# Patient Record
Sex: Female | Born: 1970 | Race: Black or African American | Hispanic: No | Marital: Single | Smoking: Current every day smoker
Health system: Southern US, Community
[De-identification: ages and names within clinical notes are randomized; demographics above are authoritative.]

## PROBLEM LIST (undated history)

## (undated) HISTORY — PX: TUBAL LIGATION: SHX77

---

## 1999-07-24 ENCOUNTER — Emergency Department (HOSPITAL_COMMUNITY): Admission: EM | Admit: 1999-07-24 | Discharge: 1999-07-24 | Payer: Self-pay | Admitting: Emergency Medicine

## 1999-07-24 ENCOUNTER — Encounter: Payer: Self-pay | Admitting: Emergency Medicine

## 1999-07-27 ENCOUNTER — Emergency Department (HOSPITAL_COMMUNITY): Admission: EM | Admit: 1999-07-27 | Discharge: 1999-07-28 | Payer: Self-pay | Admitting: Emergency Medicine

## 1999-07-28 ENCOUNTER — Encounter: Payer: Self-pay | Admitting: Emergency Medicine

## 2001-12-19 ENCOUNTER — Encounter: Payer: Self-pay | Admitting: Emergency Medicine

## 2001-12-19 ENCOUNTER — Emergency Department (HOSPITAL_COMMUNITY): Admission: EM | Admit: 2001-12-19 | Discharge: 2001-12-19 | Payer: Self-pay | Admitting: Emergency Medicine

## 2003-05-07 ENCOUNTER — Emergency Department (HOSPITAL_COMMUNITY): Admission: EM | Admit: 2003-05-07 | Discharge: 2003-05-07 | Payer: Self-pay | Admitting: Emergency Medicine

## 2004-09-11 ENCOUNTER — Ambulatory Visit: Payer: Self-pay | Admitting: Family Medicine

## 2005-02-21 ENCOUNTER — Observation Stay (HOSPITAL_COMMUNITY): Admission: AD | Admit: 2005-02-21 | Discharge: 2005-02-22 | Payer: Self-pay | Admitting: Obstetrics and Gynecology

## 2005-02-21 ENCOUNTER — Encounter: Payer: Self-pay | Admitting: *Deleted

## 2006-01-19 ENCOUNTER — Ambulatory Visit (HOSPITAL_COMMUNITY): Admission: RE | Admit: 2006-01-19 | Discharge: 2006-01-19 | Payer: Self-pay | Admitting: *Deleted

## 2006-01-27 ENCOUNTER — Ambulatory Visit: Payer: Self-pay | Admitting: Gynecology

## 2006-02-21 ENCOUNTER — Ambulatory Visit: Payer: Self-pay | Admitting: Gynecology

## 2006-03-10 ENCOUNTER — Ambulatory Visit: Payer: Self-pay | Admitting: Gynecology

## 2006-03-15 ENCOUNTER — Ambulatory Visit (HOSPITAL_COMMUNITY): Admission: RE | Admit: 2006-03-15 | Discharge: 2006-03-15 | Payer: Self-pay | Admitting: Family Medicine

## 2006-03-28 ENCOUNTER — Ambulatory Visit: Payer: Self-pay | Admitting: Obstetrics & Gynecology

## 2006-04-11 ENCOUNTER — Ambulatory Visit: Payer: Self-pay | Admitting: Gynecology

## 2006-04-18 ENCOUNTER — Ambulatory Visit: Payer: Self-pay | Admitting: Family Medicine

## 2006-04-25 ENCOUNTER — Ambulatory Visit: Payer: Self-pay | Admitting: Family Medicine

## 2006-05-02 ENCOUNTER — Ambulatory Visit: Payer: Self-pay | Admitting: Obstetrics & Gynecology

## 2006-05-05 ENCOUNTER — Ambulatory Visit (HOSPITAL_COMMUNITY): Admission: RE | Admit: 2006-05-05 | Discharge: 2006-05-05 | Payer: Self-pay | Admitting: Family Medicine

## 2006-05-09 ENCOUNTER — Ambulatory Visit: Payer: Self-pay | Admitting: *Deleted

## 2006-05-10 ENCOUNTER — Ambulatory Visit: Payer: Self-pay | Admitting: Gynecology

## 2006-05-10 ENCOUNTER — Inpatient Hospital Stay (HOSPITAL_COMMUNITY): Admission: AD | Admit: 2006-05-10 | Discharge: 2006-05-12 | Payer: Self-pay | Admitting: Obstetrics & Gynecology

## 2007-10-02 ENCOUNTER — Emergency Department (HOSPITAL_COMMUNITY): Admission: EM | Admit: 2007-10-02 | Discharge: 2007-10-02 | Payer: Self-pay | Admitting: Family Medicine

## 2007-11-07 ENCOUNTER — Emergency Department (HOSPITAL_COMMUNITY): Admission: EM | Admit: 2007-11-07 | Discharge: 2007-11-07 | Payer: Self-pay | Admitting: Family Medicine

## 2007-11-14 ENCOUNTER — Emergency Department (HOSPITAL_COMMUNITY): Admission: EM | Admit: 2007-11-14 | Discharge: 2007-11-15 | Payer: Self-pay | Admitting: Emergency Medicine

## 2010-10-03 ENCOUNTER — Emergency Department (HOSPITAL_COMMUNITY)
Admission: EM | Admit: 2010-10-03 | Discharge: 2010-10-03 | Payer: Self-pay | Source: Home / Self Care | Admitting: Emergency Medicine

## 2010-10-04 ENCOUNTER — Encounter: Payer: Self-pay | Admitting: *Deleted
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
## 2011-01-29 NOTE — H&P (Signed)
NAME:  Hannah May, Hannah May                ACCOUNT NO.:  [id number]   MEDICAL RECORD NO.:  [id number]          PATIENT TYPE:  INP   LOCATION:  9319                          FACILITY:  [facility]   PHYSICIAN:  Juluis Mire, M.D.   DATE OF BIRTH:  30-Jul-1971   DATE OF ADMISSION:  02/21/2005  DATE OF DISCHARGE:                                HISTORY & PHYSICAL   HISTORY OF PRESENT ILLNESS:  The patient is a 40 year old gravida 4, para 3,  abortus 1, black female who presents to rule out ovarian torsion. Her last  menstrual period was the end of May, using condoms for birth control  purposes. At 5 a.m. today she had sudden onset of left flank and lower  abdominal pain. This was associated with nausea and vomiting. Denied any  fever. No change in bowel or bladder habits. Was seen at [facility] emergency room. A CT scan revealed a complex cyst in the left  adnexa. Subsequent ultrasound revealed a 4.7 cm homogenous mass. Blood flow  was limited. This could represent either endometrioma or other ovarian  process. They said that they could not rule out torsion and clinical  correlation would be required.   ALLERGIES:  No known drug allergies.   MEDICATIONS:  None.   PAST MEDICAL HISTORY:  The usual childhood diseases without any significant  sequela.   PAST SURGICAL HISTORY:  No surgical history.   OBSTETRICAL HISTORY:  Three vaginal deliveries, one spontaneous abortion.   SOCIAL HISTORY:  Does reveal tobacco but no alcohol or drug use.   FAMILY HISTORY:  Noncontributory.   REVIEW OF SYSTEMS:  Noncontributory.   LABORATORY DATA:  Her white count was 7700, platelet count was 219,000,  hemoglobin was 13.4. Urine pregnancy test was negative. Urinalysis was  basically clear.   PHYSICAL EXAMINATION:  VITAL SIGNS:  Temperature was 97.9, other vital signs  were stable.  GENERAL:  She appeared relatively comfortable at the time of exam.  LUNGS:  Clear.  CARDIOVASCULAR:  Regular rate and  rhythm without murmurs or gallops.  ABDOMEN:  There was mild left lower quadrant tenderness, no peritoneal signs  and bowel sounds were active. There was no CVA tenderness, no masses  appreciated.  PELVIC:  Normal external genitalia. Vaginal mucosa clear. Cervix  unremarkable, no purulent discharge. No significant pain with uterine  manipulation, no sign of fullness, moderately tender. Right adnexa  unremarkable.  EXTREMITIES:  Pria edema.  NEUROLOGIC EXAM:  Grossly within normal limits.   IMPRESSION:  Complex cyst of left adnexa, clinically does not appear to have  ovarian torsion.   PLAN:  At the present time we are going to watch in the hospital. She will  be maintained on analgesics. A CBC will be checked later this afternoon.  Will repeat the ultrasound tomorrow. If her clinical condition should worsen  will proceed with laparoscopic evaluation. This has been discussed with the  patient.       JSM/MEDQ  D:  02/21/2005  T:  02/21/2005  Job:  [id number]

## 2011-06-04 LAB — POCT PREGNANCY, URINE
Operator id: 116391
Preg Test, Ur: NEGATIVE

## 2011-06-04 LAB — WET PREP, GENITAL
Clue Cells Wet Prep HPF POC: NONE SEEN
Yeast Wet Prep HPF POC: NONE SEEN

## 2011-06-04 LAB — POCT URINALYSIS DIP (DEVICE)
Bilirubin Urine: NEGATIVE
Nitrite: NEGATIVE
Urobilinogen, UA: 1

## 2011-06-04 LAB — GC/CHLAMYDIA PROBE AMP, GENITAL
Chlamydia, DNA Probe: NEGATIVE
GC Probe Amp, Genital: NEGATIVE

## 2013-06-06 ENCOUNTER — Encounter (HOSPITAL_COMMUNITY): Payer: Self-pay

## 2013-06-06 ENCOUNTER — Emergency Department (HOSPITAL_COMMUNITY)
Admission: EM | Admit: 2013-06-06 | Discharge: 2013-06-06 | Disposition: A | Discharge status: Home / Self Care | Payer: Medicaid Other | Attending: Emergency Medicine | Admitting: Emergency Medicine

## 2013-06-06 DIAGNOSIS — F17200 Nicotine dependence, unspecified, uncomplicated: Secondary | ICD-10-CM | POA: Insufficient documentation

## 2013-06-06 DIAGNOSIS — Z23 Encounter for immunization: Secondary | ICD-10-CM | POA: Insufficient documentation

## 2013-06-06 DIAGNOSIS — S61209A Unspecified open wound of unspecified finger without damage to nail, initial encounter: Secondary | ICD-10-CM | POA: Insufficient documentation

## 2013-06-06 DIAGNOSIS — IMO0002 Reserved for concepts with insufficient information to code with codable children: Secondary | ICD-10-CM

## 2013-06-06 DIAGNOSIS — R011 Cardiac murmur, unspecified: Secondary | ICD-10-CM | POA: Insufficient documentation

## 2013-06-06 DIAGNOSIS — S61412A Laceration without foreign body of left hand, initial encounter: Secondary | ICD-10-CM

## 2013-06-06 DIAGNOSIS — F1010 Alcohol abuse, uncomplicated: Secondary | ICD-10-CM | POA: Insufficient documentation

## 2013-06-06 DIAGNOSIS — S61409A Unspecified open wound of unspecified hand, initial encounter: Secondary | ICD-10-CM | POA: Insufficient documentation

## 2013-06-06 DIAGNOSIS — S61210A Laceration without foreign body of right index finger without damage to nail, initial encounter: Secondary | ICD-10-CM

## 2013-06-06 MED ORDER — TETANUS-DIPHTH-ACELL PERTUSSIS 5-2.5-18.5 LF-MCG/0.5 IM SUSP
0.5000 mL | Freq: Once | INTRAMUSCULAR | Status: AC
  Administered 2013-06-06: 0.5 mL via INTRAMUSCULAR
  Filled 2013-06-06: qty 0.5
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
## 2013-06-06 NOTE — ED Provider Notes (Signed)
Medical screening examination/treatment/procedure(s) were performed by non-physician practitioner and as supervising physician I was immediately available for consultation/collaboration.  Sunnie Nielsen, MD 06/06/13 [phone]
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
## 2013-06-18 ENCOUNTER — Emergency Department (INDEPENDENT_AMBULATORY_CARE_PROVIDER_SITE_OTHER)
Admission: EM | Admit: 2013-06-18 | Discharge: 2013-06-18 | Disposition: A | Discharge status: Home / Self Care | Payer: Medicaid Other | Source: Home / Self Care | Attending: Family Medicine | Admitting: Family Medicine

## 2013-06-18 ENCOUNTER — Encounter (HOSPITAL_COMMUNITY): Payer: Self-pay | Admitting: Emergency Medicine

## 2013-06-18 DIAGNOSIS — IMO0002 Reserved for concepts with insufficient information to code with codable children: Secondary | ICD-10-CM

## 2013-06-18 DIAGNOSIS — S8391XA Sprain of unspecified site of right knee, initial encounter: Secondary | ICD-10-CM
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
## 2015-05-24 ENCOUNTER — Encounter (HOSPITAL_COMMUNITY): Payer: Self-pay

## 2015-05-24 ENCOUNTER — Emergency Department (HOSPITAL_COMMUNITY)
Admission: EM | Admit: 2015-05-24 | Discharge: 2015-05-24 | Disposition: A | Discharge status: Home / Self Care | Payer: Medicaid Other | Attending: Emergency Medicine | Admitting: Emergency Medicine

## 2015-05-24 DIAGNOSIS — K0889 Other specified disorders of teeth and supporting structures: Secondary | ICD-10-CM

## 2015-05-24 DIAGNOSIS — K088 Other specified disorders of teeth and supporting structures: Secondary | ICD-10-CM | POA: Diagnosis present

## 2015-05-24 DIAGNOSIS — Z720 Tobacco use: Secondary | ICD-10-CM | POA: Insufficient documentation

## 2015-05-24 DIAGNOSIS — K002 Abnormalities of size and form of teeth: Secondary | ICD-10-CM | POA: Insufficient documentation

## 2015-05-24 MED ORDER — IBUPROFEN 800 MG PO TABS
800.0000 mg | ORAL_TABLET | Freq: Once | ORAL | Status: AC
  Administered 2015-05-24: 800 mg via ORAL
  Filled 2015-05-24: qty 1

## 2015-05-24 MED ORDER — IBUPROFEN 600 MG PO TABS: 600.0000 mg | ORAL_TABLET | Freq: Three times a day (TID) | ORAL | Status: DC | PRN

## 2015-05-24 MED ORDER — PENICILLIN V POTASSIUM 500 MG PO TABS: 500.0000 mg | ORAL_TABLET | Freq: Three times a day (TID) | ORAL | Status: AC

## 2015-05-24 NOTE — Discharge Instructions (Signed)
Dental Pain °A tooth ache may be caused by cavities (tooth decay). Cavities expose the nerve of the tooth to air and hot or cold temperatures. It may come from an infection or abscess (also called a boil or furuncle) around your tooth. It is also often caused by dental caries (tooth decay). This causes the pain you are having. °DIAGNOSIS  °Your caregiver can diagnose this problem by exam. °TREATMENT  °· If caused by an infection, it may be treated with medications which kill germs (antibiotics) and pain medications as prescribed by your caregiver. Take medications as directed. °· Only take over-the-counter or prescription medicines for pain, discomfort, or fever as directed by your caregiver. °· Whether the tooth ache today is caused by infection or dental disease, you should see your dentist as soon as possible for further care. °SEEK MEDICAL CARE IF: °The exam and treatment you received today has been provided on an emergency basis only. This is not a substitute for complete medical or dental care. If your problem worsens or new problems (symptoms) appear, and you are unable to meet with your dentist, call or return to this location. °SEEK IMMEDIATE MEDICAL CARE IF:  °· You have a fever. °· You develop redness and swelling of your face, jaw, or neck. °· You are unable to open your mouth. °· You have severe pain uncontrolled by pain medicine. °MAKE SURE YOU:  °· Understand these instructions. °· Will watch your condition. °· Will get help right away if you are not doing well or get worse. °Document Released: 08/30/2005 Document Revised: 11/22/2011 Document Reviewed: 04/17/2008 °ExitCare® Patient Information ©2015 [employer]. This information is not intended to replace advice given to you by your health care provider. Make sure you discuss any questions you have with your health care provider. ° °Emergency Department Resource Guide °1) Find a Doctor and Pay Out of Pocket °Although you won't have to find out who  is covered by your insurance plan, it is a good idea to ask around and get recommendations. You will then need to call the office and see if the doctor you have chosen will accept you as a new patient and what types of options they offer for patients who are self-pay. Some doctors offer discounts or will set up payment plans for their patients who do not have insurance, but you will need to ask so you aren't surprised when you get to your appointment. ° °2) Contact Your Local Health Department °Not all health departments have doctors that can see patients for sick visits, but many do, so it is worth a call to see if yours does. If you don't know where your local health department is, you can check in your phone book. The CDC also has a tool to help you locate your state's health department, and many state websites also have listings of all of their local health departments. ° °3) Find a Walk-in Clinic °If your illness is not likely to be very severe or complicated, you may want to try a walk in clinic. These are popping up all over the country in pharmacies, drugstores, and shopping centers. They're usually staffed by nurse practitioners or physician assistants that have been trained to treat common illnesses and complaints. They're usually fairly quick and inexpensive. However, if you have serious medical issues or chronic medical problems, these are probably not your best option. ° °No Primary Care Doctor: °- Call [facility] at  832-8000 - they can help you locate a primary   care doctor that  accepts your insurance, provides certain services, etc. °- Physician Referral Service- 1-800-533-3463 ° °Chronic Pain Problems: °Organization         Address  Phone   Notes  °[facility]  (336) 297-2271 Patients need to be referred by their primary care doctor.  ° °Medication Assistance: °Organization         Address  Phone   Notes  °[place] [facility] [address]  [address] °[place], [place] [address] (336) 641-8030 --Must be a resident of [place] °-- Must have NO insurance coverage whatsoever (no Medicaid/ Medicare, etc.) °-- The pt. MUST have a primary care doctor that directs their care regularly and follows them in the community °  °[facility]  (866) 331-1348   °[employer]  (888) 892-1162   ° °Agencies that provide inexpensive medical care: °Organization         Address  Phone   Notes  °[facility]  (336) 832-8035   °[facility]    (336) 832-7272   °[facility] [address] °[place], [place] [address] (336) 832-4777   °[facility] [address], °[place] (336) 271-4999   °[facility]    (336) 373-0678   °[facility]    (336) 272-1050   °[facility] ° [address], [place] Phone:  (336) 832-4444, Fax:  (336) 832-4440 Hours of Operation:  9 am - 6 pm, M-F.  Also accepts Medicaid/Medicare and self-pay.  °[facility] ° [address], [place] Phone: (336) 832-3150, Fax: (336) 832-3151. Hours of Operation:  8:30 am - 5:30 pm, M-F.  Also accepts Medicaid and self-pay.  °[facility] [place] [address], [place] Phone: (336) 878-6027   °[facility] [address], [place], [place] (336)723-1848, Ext. 123 Mondays & Thursdays: 7-9 AM.  First 15 patients are seen on a first come, first serve basis. °  ° °Medicaid-accepting [place] Providers: ° °Organization         Address  Phone   Notes  °[facility] [address], [place] (336) 641-2100 Also accepts self-pay patients.  °[facility] [address], [place] ° (336) 856-9996   °[facility] [address], [place] (336) 288-8857   °[facility] [address] [place] [address], [place] (336) 299-7000   °Veita Bland [address], [place]  ° (336) 373-1557 Only accepts [place] Medicaid patients after they have their name applied to their card.  ° °Self-Pay (no insurance) in [place]: ° °Organization         Address  Phone   Notes  °Sickle Cell Patients, [facility] [address], [place] (336) 832-1970   °[facility] [address], [place] (336) 832-4400   °[facility] [place] ° [address], [place] (336) 992-4800   °[facility]/Dr. Osei-Bonsu ° [address] [place] [address], [place] or [address], [place] (336) 841-8500 Phone number for both [place] and [place] locations is the same.  °[facility] [address], [place] (336) 299-0000   °[facility]  [address] [place] [address],  or [address] (336) 852-7530 °(336) 878-2260   °  [facility] [address], [place] (336) 350-1642, phone; (336) 294-5005, fax Sees patients 1st and 3rd Saturday of every month.  Must not qualify for public or private insurance (i.e. Medicaid, Medicare, [place] Health Choice, Veterans' Benefits) • Household income should be no more than 200% of the poverty level •The clinic cannot treat you if you are pregnant or think you are pregnant • Sexually transmitted diseases are not treated at the clinic.  ° ° °Dental Care: °Organization         Address  Phone  Notes  °[facility] [address], [place] (336) 641-6152 Accepts children up to age 21 who are enrolled in Medicaid or [place] Health Choice; pregnant women with a Medicaid card; and children who have applied for Medicaid or [place] Health Choice, but were declined, whose parents can pay a reduced fee at time of service.  °[facility] [place]  [address], [place] (336) 641-7733 Accepts children up to age 21 who are  enrolled in Medicaid or [place] Health Choice; pregnant women with a Medicaid card; and children who have applied for Medicaid or [place] Health Choice, but were declined, whose parents can pay a reduced fee at time of service.  °[facility] ° [address], [place] (336) 641-4533 Patients are seen by appointment only. Walk-ins are not accepted. [facility] will see patients 18 years of age and older. °Monday - Tuesday (8am-5pm) °Most Wednesdays (8:30-5pm) °$30 per visit, cash only  °[facility] ° [address], [place] (336) 641-4533 Patients are seen by appointment only. Walk-ins are not accepted. [facility] will see patients 18 years of age and older. °One Wednesday Evening (Monthly: Volunteer Based).  $30 per visit, cash only  °[facility]  (919) 537-3737 for adults; Children under age 4, call Graduate Pediatric Dentistry at (919) [phone]. Children aged 4-14, please call (919) [phone] to request a pediatric application. ° Dental services are provided in all areas of dental care including fillings, crowns and bridges, complete and partial dentures, implants, gum treatment, root canals, and extractions. Preventive care is also provided. Treatment is provided to both adults and children. °Patients are selected via a lottery and there is often a waiting list. °  °[facility] [address], °[place] ° (336) 763-8833 [email or web address] °  °[facility] [address], [place], [place] (336)723-1848, Ext. 123 Second and Fourth Thursday of each month, opens at 6:30 AM; Clinic ends at 9 AM.  Patients are seen on a first-come first-served basis, and a limited number are seen during each clinic.  ° °[facility] ° [address], [place], [place] (336) 723-7904   Eligibility Requirements °You must have lived in [place], [place], or [place] counties for at least the last three months. °  You  cannot be eligible for state or federal sponsored healthcare insurance, including Veterans Administration, Medicaid, or Medicare. °  You generally cannot be eligible for healthcare insurance through your employer.  °  How to apply: °Eligibility screenings are held every Tuesday and Wednesday afternoon from 1:00 pm until 4:00 pm. You do not need an appointment for the interview!  °[facility] [address], [place],  336-631-2330   °[facility]  336-342-8273   °[facility]  336-703-3100   °[facility]  336-570-6415   ° °Behavioral Health Resources in the Community: °Intensive Outpatient Programs °Organization         Address  Phone  Notes  °[facility] [address], [place], [place] 336-878-6098   ° [facility] [address], [place], [place] 336-832-9800   °[facility] [address], [place], [place] ° 336-882-2125   °[facility] [address],  °[place], [place] 1-800-853-5163 or 336-641-4981   °Substance Abuse Resources °Organization         Address  Phone  Notes  °[facility]  336-882-2125   °[facility]  336-784-9470   °[facility]  336-285-9073   °[facility]  336-845-3988   °Residential & Outpatient Substance Abuse Program  1-800-659-3381   °Psychological Services °Organization         Address  Phone  Notes  ° Health  336- 832-9600   °[facility]  336- 378-7881   °[facility] [address], [place] 1-800-853-5163 or 336-641-4981   ° °Mobile Crisis Teams °Organization         Address  Phone  Notes  °[facility], Mobile Crisis Care Unit  1-877-626-1772   °Assertive °[facility] ° [address] [place], [place] 336-834-9664   °Sharon DeEsch [address] °[place] 336-554-5454   ° °Self-Help/Support  Groups °Organization         Address  Phone             Notes  °[facility] [place] - variety of support groups  336- 373-1402 Call for more information  °Narcotics Anonymous (NA), [facility] [address], °[place]  2 meetings at this location  ° °Residential Treatment Programs °Organization         Address  Phone  Notes  °[facility] [address],    °[place]  1-866-801-8205   °[facility] ° [address], [place], [place] 704-293-8524   °[facility] [address], [place] 336-845-3988 Admissions: 8am-3pm M-F  °[facility] [address]    °[place], [place] 336-841-1104   °[facility] [address], [place], [place] 336-379-7146   °[facility] [address]  °[place], [place] 336-285-9073   °[facility] - Intensive Outpatient [address], [place], [place] 336-852-3033   °[facility] [address]  °[place], [place] 1-877-615-2722 or 336-784-9470   °[facility] [address] [place], [place] 336-227-7417 Accepts Medicaid  °[facility] [address]  °[place] 1-800-659-3381 Substance Abuse/Addiction Treatment  ° °[place] Behavioral Health Resources °Organization         Address  Phone  Notes  °[facility]  (888) 581-9988   °Julie Brannon, PhD [address] [place], [place]   (336) 349-5553 or (336) 951-0000   °[facility]   [address] °[place], [place] (336) 349-4454   °[facility] [address], [place], [place] (336) 342-8316 Insurance/Medicaid/sponsorship through [facility]  °[facility] [address] [place] [address]                                    [place], [place] (336) 342-8316 Therapy/tele-psych/case  °[facility]   [address]  ° [place], [place] (336) 349-2233    °Dr. Arfeen  (336) 349-4544   °[facility] [place]  [facility]  [employer] [facility] [place] [facility] 1) [address], [place] °2) [address], [place] °3)  [address], [place] (336) 349-3220 °(336) 342-7768 ° °(336) 342-8140   °[place] [facility] (336) 342-1394 or (336) 342-3537 (After Hours)    ° ° ° °

## 2015-05-24 NOTE — ED Provider Notes (Signed)
CSN: [id number]     Arrival date & time 05/24/15  0732 History   First MD Initiated Contact with Patient 05/24/15 [phone]     Chief Complaint  Patient presents with  . Dental Pain      HPI Patient presents the emergency department complaining of bilateral upper dental pain worsening over the past several days.  She's had this pain intermittently for several months.  No fevers or chills.  No difficulty breathing or swallowing.  No facial swelling.   History reviewed. No pertinent past medical history. History reviewed. No pertinent past surgical history. History reviewed. No pertinent family history. Social History  Substance Use Topics  . Smoking status: Current Some Day Smoker  . Smokeless tobacco: None  . Alcohol Use: Yes   OB History    No data available     Review of Systems  All other systems reviewed and are negative.     Allergies  Review of patient's allergies indicates no known allergies.  Home Medications   Prior to Admission medications   Medication Sig Start Date End Date Taking? Authorizing Provider  ibuprofen (ADVIL,MOTRIN) 600 MG tablet Take 1 tablet (600 mg total) by mouth every 8 (eight) hours as needed. 05/24/15   Azalia Bilis, MD  penicillin v potassium (VEETID) 500 MG tablet Take 1 tablet (500 mg total) by mouth 3 (three) times daily. 05/24/15 05/31/15  Azalia Bilis, MD   BP 131/84 mmHg  Pulse 89  Temp(Src) 97.9 F (36.6 C) (Oral)  Resp 18  SpO2 100%  LMP 05/07/2015 Physical Exam  Constitutional: She is oriented to person, place, and time. She appears well-developed and well-nourished.  HENT:  Head: Normocephalic.  Reported dentition diffusely.  Patient with focal tenderness of her first upper molars bilaterally.  No gingival swelling or fluctuance.  No facial swelling.  Eyes: EOM are normal.  Neck: Normal range of motion.  Pulmonary/Chest: Effort normal.  Abdominal: She exhibits no distension.  Musculoskeletal: Normal range of motion.   Neurological: She is alert and oriented to person, place, and time.  Psychiatric: She has a normal mood and affect.  Nursing note and vitals reviewed.   ED Course  Procedures (including critical care time) Labs Review Labs Reviewed - No data to display  Imaging Review No results found. I have personally reviewed and evaluated these images and lab results as part of my medical decision-making.   EKG Interpretation None      MDM   Final diagnoses:  Pain, dental    Dental Pain. Home with antibiotics and pain medicine. Recommend dental follow up. No signs of gingival abscess. Tolerating secretions. Airway patent. No sub lingular swelling     Azalia Bilis, MD 05/24/15 0800

## 2015-05-24 NOTE — ED Notes (Signed)
Per pt, dental pain x 3 months.

## 2015-06-09 ENCOUNTER — Emergency Department (HOSPITAL_COMMUNITY)
Admission: EM | Admit: 2015-06-09 | Discharge: 2015-06-09 | Disposition: A | Discharge status: Home / Self Care | Payer: Medicaid Other | Attending: Emergency Medicine | Admitting: Emergency Medicine

## 2015-06-09 ENCOUNTER — Encounter (HOSPITAL_COMMUNITY): Payer: Self-pay | Admitting: Cardiology

## 2015-06-09 DIAGNOSIS — Z720 Tobacco use: Secondary | ICD-10-CM | POA: Diagnosis not present

## 2015-06-09 DIAGNOSIS — R21 Rash and other nonspecific skin eruption: Secondary | ICD-10-CM | POA: Diagnosis present

## 2015-06-09 DIAGNOSIS — B009 Herpesviral infection, unspecified: Secondary | ICD-10-CM | POA: Insufficient documentation

## 2015-06-09 MED ORDER — NAPROXEN 375 MG PO TABS: 375.0000 mg | ORAL_TABLET | Freq: Two times a day (BID) | ORAL | Status: DC

## 2015-06-09 MED ORDER — ACYCLOVIR 400 MG PO TABS: 400.0000 mg | ORAL_TABLET | Freq: Four times a day (QID) | ORAL | Status: DC

## 2015-06-09 MED ORDER — LORATADINE 10 MG PO TABS: 10.0000 mg | ORAL_TABLET | Freq: Every day | ORAL | Status: DC

## 2015-06-09 NOTE — ED Notes (Signed)
Pt reports a rash to her lips that she noticed yesterday.

## 2015-06-09 NOTE — ED Notes (Signed)
Declined W/C at D/C and was escorted to lobby by RN. 

## 2015-06-09 NOTE — Discharge Instructions (Signed)
You may take Benadryl for the itching, burning and swelling of the lips. Return if symptoms worsen.

## 2015-06-09 NOTE — ED Provider Notes (Signed)
CSN: [id number]     Arrival date & time 06/09/15  1207 History  This chart was scribed for non-physician practitioner Kerrie Buffalo, NP working with Gwyneth Sprout, MD by Murriel Hopper, ED Scribe. This patient was seen in room TR02C/TR02C and the patient's care was started at 3:33 PM.    Chief Complaint  Patient presents with  . Rash      The history is provided by the patient. No language interpreter was used.    HPI Comments: Hannah May is a 44 y.o. female who presents to the Emergency Department complaining of an itching, blistering, sore rash on the outside of her lips that have been present since yesterday. Pt states that she noticed it when she left a pizza restaurant with her kids yesterday, and denies eating anything abnormal. Pt denies any other symptoms. Pt has not tried anything to treat the complaint.   History reviewed. No pertinent past medical history. History reviewed. No pertinent past surgical history. History reviewed. No pertinent family history. Social History  Substance Use Topics  . Smoking status: Current Some Day Smoker  . Smokeless tobacco: None  . Alcohol Use: Yes   OB History    No data available     Review of Systems  Negative except as stated in HPI   Allergies  Review of patient's allergies indicates no known allergies.  Home Medications   Prior to Admission medications   Medication Sig Start Date End Date Taking? Authorizing Provider  acyclovir (ZOVIRAX) 400 MG tablet Take 1 tablet (400 mg total) by mouth 4 (four) times daily. 06/09/15   Hope Orlene Och, NP  ibuprofen (ADVIL,MOTRIN) 600 MG tablet Take 1 tablet (600 mg total) by mouth every 8 (eight) hours as needed. 05/24/15   Azalia Bilis, MD  loratadine (CLARITIN) 10 MG tablet Take 1 tablet (10 mg total) by mouth daily. 06/09/15   Hope Orlene Och, NP  naproxen (NAPROSYN) 375 MG tablet Take 1 tablet (375 mg total) by mouth 2 (two) times daily. 06/09/15   Hope Orlene Och, NP   BP 148/82 mmHg  Pulse  63  Temp(Src) 98.3 F (36.8 C) (Oral)  Resp 18  Ht  (1.575 m)  Wt 150 lb (68.04 kg)  BMI 27.43 kg/m2  SpO2 100%  LMP 05/27/2015 Physical Exam  Constitutional: She is oriented to person, place, and time. She appears well-developed and well-nourished.  Non-toxic appearance. No distress.  HENT:  Head: Atraumatic.  Nose: Nose normal.  Mouth/Throat: Uvula is midline, oropharynx is clear and moist and mucous membranes are normal.  Vesicular lesions to upper and lower lips.   Eyes: Conjunctivae, EOM and lids are normal.  Neck: Normal range of motion. Neck supple.  Cardiovascular: Normal rate.   Pulmonary/Chest: Effort normal and breath sounds normal.  Abdominal: Normal appearance.  Musculoskeletal: Normal range of motion.  Neurological: She is alert and oriented to person, place, and time. She has normal strength. No cranial nerve deficit.  Skin: Skin is warm and dry. No abrasion noted.  Psychiatric: She has a normal mood and affect. Her speech is normal and behavior is normal.  Nursing note and vitals reviewed.   ED Course  Procedures (including critical care time)  DIAGNOSTIC STUDIES: Oxygen Saturation is 99% on room air, normal by my interpretation.    COORDINATION OF CARE: 3:36 PM Discussed treatment plan with pt at bedside and pt agreed to plan.   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 Discussed with the patient and all questioned fully answered. She will return if any problems arise.    Final diagnoses:  Herpes simplex   I personally performed the services described in this documentation, which was scribed in my presence. The recorded information has been reviewed and is accurate.    [address] [place], [place] 06/11/15 1610  Gwyneth Sprout, MD 06/11/15 [phone]

## 2015-06-23 ENCOUNTER — Encounter (HOSPITAL_COMMUNITY): Payer: Self-pay | Admitting: Emergency Medicine

## 2015-06-23 ENCOUNTER — Emergency Department (HOSPITAL_COMMUNITY): Payer: Medicaid Other

## 2015-06-23 ENCOUNTER — Emergency Department (HOSPITAL_COMMUNITY)
Admission: EM | Admit: 2015-06-23 | Discharge: 2015-06-23 | Disposition: A | Discharge status: Home / Self Care | Payer: Medicaid Other | Attending: Emergency Medicine | Admitting: Emergency Medicine

## 2015-06-23 DIAGNOSIS — X58XXXA Exposure to other specified factors, initial encounter: Secondary | ICD-10-CM | POA: Diagnosis not present

## 2015-06-23 DIAGNOSIS — Y998 Other external cause status: Secondary | ICD-10-CM | POA: Diagnosis not present

## 2015-06-23 DIAGNOSIS — S4992XA Unspecified injury of left shoulder and upper arm, initial encounter: Secondary | ICD-10-CM | POA: Insufficient documentation

## 2015-06-23 DIAGNOSIS — S199XXA Unspecified injury of neck, initial encounter: Secondary | ICD-10-CM | POA: Diagnosis not present

## 2015-06-23 DIAGNOSIS — Z791 Long term (current) use of non-steroidal anti-inflammatories (NSAID): Secondary | ICD-10-CM | POA: Diagnosis not present

## 2015-06-23 DIAGNOSIS — Z720 Tobacco use: Secondary | ICD-10-CM | POA: Diagnosis not present

## 2015-06-23 DIAGNOSIS — Z79899 Other long term (current) drug therapy: Secondary | ICD-10-CM | POA: Diagnosis not present

## 2015-06-23 DIAGNOSIS — M542 Cervicalgia: Secondary | ICD-10-CM

## 2015-06-23 DIAGNOSIS — Y92008 Other place in unspecified non-institutional (private) residence as the place of occurrence of the external cause: Secondary | ICD-10-CM | POA: Insufficient documentation

## 2015-06-23 DIAGNOSIS — Y9389 Activity, other specified: Secondary | ICD-10-CM | POA: Insufficient documentation

## 2015-06-23 DIAGNOSIS — M79602 Pain in left arm: Secondary | ICD-10-CM

## 2015-06-23 MED ORDER — METHOCARBAMOL 500 MG PO TABS: 500.0000 mg | ORAL_TABLET | Freq: Two times a day (BID) | ORAL | Status: DC

## 2015-06-23 MED ORDER — METHOCARBAMOL 500 MG PO TABS
500.0000 mg | ORAL_TABLET | Freq: Once | ORAL | Status: AC
  Administered 2015-06-23: 500 mg via ORAL
  Filled 2015-06-23: qty 1

## 2015-06-23 MED ORDER — IBUPROFEN 800 MG PO TABS: 800.0000 mg | ORAL_TABLET | Freq: Three times a day (TID) | ORAL | Status: DC

## 2015-06-23 MED ORDER — IBUPROFEN 800 MG PO TABS
800.0000 mg | ORAL_TABLET | Freq: Once | ORAL | Status: AC
  Administered 2015-06-23: 800 mg via ORAL
  Filled 2015-06-23: qty 1

## 2015-06-23 NOTE — ED Notes (Signed)
Per pt, states she is having neck pain radiating down left arm

## 2015-06-23 NOTE — Discharge Instructions (Signed)
1. Medications: ibuprofen, robaxin, usual home medications 2. Treatment: rest, drink plenty of fluids, ice 3. Follow Up: please followup with your primary doctor in 2-3 days for discussion of your diagnoses and further evaluation after today's visit; if you do not have a primary care doctor use the resource guide provided to find one; please return to the ER for severe headache, weakness, numbness, new or worsening symptoms   Musculoskeletal Pain Musculoskeletal pain is muscle and boney aches and pains. These pains can occur in any part of the body. Your caregiver may treat you without knowing the cause of the pain. They may treat you if blood or urine tests, X-rays, and other tests were normal.  CAUSES There is often not a definite cause or reason for these pains. These pains may be caused by a type of germ (virus). The discomfort may also come from overuse. Overuse includes working out too hard when your body is not fit. Boney aches also come from weather changes. Bone is sensitive to atmospheric pressure changes. HOME CARE INSTRUCTIONS   Ask when your test results will be ready. Make sure you get your test results.  Only take over-the-counter or prescription medicines for pain, discomfort, or fever as directed by your caregiver. If you were given medications for your condition, do not drive, operate machinery or power tools, or sign legal documents for 24 hours. Do not drink alcohol. Do not take sleeping pills or other medications that may interfere with treatment.  Continue all activities unless the activities cause more pain. When the pain lessens, slowly resume normal activities. Gradually increase the intensity and duration of the activities or exercise.  During periods of severe pain, bed rest may be helpful. Lay or sit in any position that is comfortable.  Putting ice on the injured area.  Put ice in a bag.  Place a towel between your skin and the bag.  Leave the ice on for 15 to 20  minutes, 3 to 4 times a day.  Follow up with your caregiver for continued problems and no reason can be found for the pain. If the pain becomes worse or does not go away, it may be necessary to repeat tests or do additional testing. Your caregiver may need to look further for a possible cause. SEEK IMMEDIATE MEDICAL CARE IF:  You have pain that is getting worse and is not relieved by medications.  You develop chest pain that is associated with shortness or breath, sweating, feeling sick to your stomach (nauseous), or throw up (vomit).  Your pain becomes localized to the abdomen.  You develop any new symptoms that seem different or that concern you. MAKE SURE YOU:   Understand these instructions.  Will watch your condition.  Will get help right away if you are not doing well or get worse.   This information is not intended to replace advice given to you by your health care provider. Make sure you discuss any questions you have with your health care provider.   Document Released: 08/30/2005 Document Revised: 11/22/2011 Document Reviewed: 05/04/2013 Elsevier Interactive Patient Education 2016 [employer].   Emergency Department Resource Guide 1) Find a Doctor and Pay Out of Pocket Although you won't have to find out who is covered by your insurance plan, it is a good idea to ask around and get recommendations. You will then need to call the office and see if the doctor you have chosen will accept you as a new patient and what types of  options they offer for patients who are self-pay. Some doctors offer discounts or will set up payment plans for their patients who do not have insurance, but you will need to ask so you aren't surprised when you get to your appointment.  2) Contact Your Local Health Department Not all health departments have doctors that can see patients for sick visits, but many do, so it is worth a call to see if yours does. If you don't know where your local health  department is, you can check in your phone book. The CDC also has a tool to help you locate your state's health department, and many state websites also have listings of all of their local health departments.  3) Find a Walk-in Clinic If your illness is not likely to be very severe or complicated, you may want to try a walk in clinic. These are popping up all over the country in pharmacies, drugstores, and shopping centers. They're usually staffed by nurse practitioners or physician assistants that have been trained to treat common illnesses and complaints. They're usually fairly quick and inexpensive. However, if you have serious medical issues or chronic medical problems, these are probably not your best option.  No Primary Care Doctor: - Call Health Connect at  [phone] - they can help you locate a primary care doctor that  accepts your insurance, provides certain services, etc. - Physician Referral Service- [phone]  Chronic Pain Problems: Organization         Address  Phone   Notes  [facility]  [phone] Patients need to be referred by their primary care doctor.   Medication Assistance: Organization         Address  Phone   Notes  [facility] Medication [facility] [address] [place] [place], [place] [address] [phone] --Must be a resident of [facility] -- Must have NO insurance coverage whatsoever (no Medicaid/ Medicare, etc.) -- The pt. MUST have a primary care doctor that directs their care regularly and follows them in the community   [facility]  [phone]   [employer]  [phone]    Agencies that provide inexpensive medical care: Organization         Address  Phone   Notes  [facility]  [phone]   [facility]    [phone]   [facility] [address] [place], [place] [address] [phone]   [facility] [address] [place] [place] [phone]   Planned Parenthood    [phone]   [facility]    [phone]   Community Health and [facility]  [address], [place] Phone:  [phone], Fax:  [phone] Hours of Operation:  9 am - 6 pm, M-F.  Also accepts Medicaid/Medicare and self-pay.  [facility]  [address], [place] Phone: [phone], Fax: [phone]. Hours of Operation:  8:30 am - 5:30 pm, M-F.  Also accepts Medicaid and self-pay.  [facility] [place] [address], [place] Phone: [phone]   [facility] [address] Natasha Bence [place], [place] [phone], Ext. [phone] Mondays & Thursdays: 7-9 AM.  First 15 patients are seen on a first come, first serve basis.    Medicaid-accepting [facility] Providers:  Organization         Address  Phone   Notes  [facility] [address], [place] [phone] Also accepts self-pay patients.  [facility] [address] Laurell Josephs [place], [place]  [phone]   [facility] [address], [place] [phone]   [facility] [address] [place] [phone]   Renaye Rakers [address], [place]   [phone] Only accepts [place] Access [place] patients after they have their name applied to their card.   Self-Pay (no insurance) in [facility]:  Organization         Address  Phone   Notes  Sickle Cell Patients, [facility] [address] [place], [place] [phone]   [facility] [address] [place], [place] [phone]   [facility]  [address],  [place] [phone]   [facility]/Dr. Osei-Bonsu  [address] [place] or [address], [place] [phone] Phone number for both [place] and  [place] locations is the same.  [facility]gent [facility] [address] [place] [phone]   [facility] [address] [place] or [address] [phone]   [facility] [address] [place] [phone], phone; [phone], fax Sees patients 1st and 3rd Saturday of every month.  Must not qualify for public or private insurance (i.e. Medicaid, Medicare, [place] Health Choice, Veterans' Benefits)  Household income should be no more than 200% of the poverty level The clinic cannot treat you if you are pregnant or think you are pregnant  Sexually transmitted diseases are not treated at the clinic.    Dental Care: Organization         Address  Phone  Notes  [facility] of [facility] [address] [place], [place] [phone] Accepts children up to age 65 who are enrolled in [place] or [place] Health Choice; pregnant women with a Medicaid card; and children who have applied for Medicaid or [place] Health Choice, but were declined, whose parents can pay a reduced fee at time of service.  [facility]  [address], [place] [phone] Accepts children up to age 58 who are enrolled in [place] or [place] Health Choice; pregnant women with a Medicaid card; and children who have applied for Medicaid or [place] Health Choice, but were declined, whose parents can pay a reduced fee at time of service.  [facility]  [address] [place], [place] [phone] Patients are seen by appointment only. Walk-ins are not accepted. [facility] will see patients 69 years of age and older. Monday - Tuesday (8am-5pm) Most Wednesdays (8:30-5pm) $30 per visit, cash only  [facility]  [address], [facility] [phone] Patients are seen by appointment only. Walk-ins are not accepted.  [facility] will see patients 2 years of age and older. One Wednesday Evening (Monthly: Volunteer Based).  $30 per visit, cash only  [employer]  [phone] for adults; Children under age 14, call Graduate Pediatric Dentistry at [phone]. Children aged 75-14, please call [phone] to request a pediatric application.  Dental services are provided in all areas of dental care including fillings, crowns and bridges, complete and partial  dentures, implants, gum treatment, root canals, and extractions. Preventive care is also provided. Treatment is provided to both adults and children. Patients are selected via a lottery and there is often a waiting list.   [facility] [address], [place]  [phone] [email or web address]   [facility] [address] [place], [place] [phone], Ext. [phone] Second and Fourth Thursday of each month, opens at 6:30 AM; Clinic ends at 9 AM.  Patients are seen on a first-come first-served basis, and a limited number are seen during each clinic.   [facility]  [address] Ether Griffins [place], [place] [phone]   Eligibility Requirements You must have lived in [place], [place], or [place] counties for at least the last three months.   You cannot be eligible for state or federal sponsored [place], including [employer], [place], or [employer].   You generally cannot be eligible for healthcare insurance through your employer.    How to apply: Eligibility screenings are held every Tuesday and Wednesday afternoon from 1:00 pm until 4:00 pm. You do not need an appointment for the interview!  [facility] [address] [place], [place] [phone]   [facility] Health Department  [phone]   [facility] Health Department  [phone]   [facility] Health Department  [phone]    Behavioral Health Resources in the  Community: Intensive Outpatient Programs Organization         Address  Phone  Notes  [facility] Services [address], [place], [place] [phone]   [facility] [address] [place], [place] [phone]   [facility] [address], [place], [place]  [phone]   [facility] Mental Health [address]  [place], [place] [phone] or [phone]   Substance Abuse Resources Organization         Address  Phone  Notes  [facility] Drug Services  [phone]   [facility]  [phone]   [facility]  [phone]   Floydene Flock  [phone]   Residential & Outpatient Substance Abuse Program  [phone]   Psychological Services Organization         Address  Phone  Notes  [facility]  [phone]   [facility] Services  [phone]   [facility] Mental Health [address], [place] [phone] or [phone]    Mobile Crisis Teams Organization         Address  Phone  Notes  [facility], Mobile Crisis Care Unit  [phone]   [facility]  [address] [place], [place] [phone]   Doristine Locks [address] [place] [phone]    Self-Help/Support Groups Organization         Address  Phone             Notes  [facility] [place] - variety of support groups  336- [phone] Call for more information  Narcotics Anonymous (NA), [facility] [address], [employer] [place]  2 meetings at this location   Statistician         Address  Phone  Notes  [facility] [address],    [place]  [phone]   [facility]  [address] [place] [address], [place], [place] [phone]   [facility] Treatment Facility [address] [place], [place] [phone] Admissions: 8am-3pm M-F  Incentives  [facility] Treatment [facility] [address],    [place], [place] [phone]   [facility] [address] [place], [place], [place] [phone]   [facility] [place] [facility] [address],  [place], [place] [phone]   [facility] - Intensive Outpatient [address] Laurell Joseph[address], [place], [place] [phone]   [facility] ([facility] [address] [place]  [place], [place] [phone] or [phone]   [facility] ([facility]) [address] [place], [place] [phone] Accepts Medicaid  [facility] [address],  [place] [phone] Substance Abuse/Addiction Treatment   [facility] Organization         Address  Phone  Notes  [facility]  [phone]   Angie Fava, PhD [address] Ervin Knack [place], [place]   [phone] or [phone]   [facility] Behavioral   [address] [place], [place] [phone]   [facility] [address], [place], [place] [phone] Insurance/Medicaid/sponsorship through [facility] and Families [address]                                    [place], [place] [phone] Therapy/tele-psych/case  [facility] [address], [place] [phone]    Dr. Lolly Mustache  [phone]   [facility]  [employer] [facility] Dept. 1) [address], [place] 2) [address] [place] 3)  [address], [place] [phone]  [phone]   [facility] Child Abuse Hotline (724) 297-7442 or ([phone])[phone] (After Hours)

## 2015-06-23 NOTE — ED Provider Notes (Signed)
CSN: [id number]     Arrival date & time 06/23/15  4098 History   First MD Initiated Contact with Patient 06/23/15 1011     Chief Complaint  Patient presents with  . Neck Pain     HPI   Hannah May is a 43 y.o. female with no pertinent PMH who presents to the ED with left-sided neck, shoulder, and arm pain. She states she was at a party on Saturday night, when the porch collapsed. She denies hitting her head or loss of consciousness. She states she thinks she might have landed on her left side. She reports constant left-sided neck, shoulder, and arm pain with associated tingling in her fingertip since waking up on Sunday morning. She states movement exacerbates her pain. She has tried "pain pills" for symptom relief, which has not been effective. She denies fever, chills, headache, lightheadedness, dizziness, vision changes, back pain, chest pain, shortness of breath, abdominal pain, nausea, vomiting.   History reviewed. No pertinent past medical history. History reviewed. No pertinent past surgical history. No family history on file. Social History  Substance Use Topics  . Smoking status: Current Some Day Smoker  . Smokeless tobacco: None  . Alcohol Use: Yes   OB History    No data available      Review of Systems  Constitutional: Negative for fever and chills.  Eyes: Negative for visual disturbance.  Respiratory: Negative for shortness of breath.   Cardiovascular: Negative for chest pain.  Gastrointestinal: Negative for nausea, vomiting, abdominal pain, diarrhea and constipation.  Genitourinary: Negative for dysuria, urgency and frequency.  Musculoskeletal: Positive for myalgias, arthralgias and neck pain. Negative for back pain.  Neurological: Negative for dizziness, syncope, weakness, light-headedness and headaches.  All other systems reviewed and are negative.     Allergies  Review of patient's allergies indicates no known allergies.  Home Medications   Prior to  Admission medications   Medication Sig Start Date End Date Taking? Authorizing Provider  acyclovir (ZOVIRAX) 400 MG tablet Take 1 tablet (400 mg total) by mouth 4 (four) times daily. 06/09/15   Hope Orlene Och, NP  ibuprofen (ADVIL,MOTRIN) 800 MG tablet Take 1 tablet (800 mg total) by mouth 3 (three) times daily. 06/23/15   Mady Gemma, PA-C  loratadine (CLARITIN) 10 MG tablet Take 1 tablet (10 mg total) by mouth daily. 06/09/15   Hope Orlene Och, NP  methocarbamol (ROBAXIN) 500 MG tablet Take 1 tablet (500 mg total) by mouth 2 (two) times daily. 06/23/15   Mady Gemma, PA-C  naproxen (NAPROSYN) 375 MG tablet Take 1 tablet (375 mg total) by mouth 2 (two) times daily. 06/09/15   Hope Orlene Och, NP    BP 130/81 mmHg  Pulse 63  Temp(Src) 98 F (36.7 C) (Oral)  Resp 16  SpO2 100%  LMP 06/21/2015 Physical Exam  Constitutional: She is oriented to person, place, and time. She appears well-developed and well-nourished. No distress.  HENT:  Head: Normocephalic and atraumatic.  Right Ear: External ear normal.  Left Ear: External ear normal.  Nose: Nose normal.  Mouth/Throat: Oropharynx is clear and moist.  Eyes: Conjunctivae and EOM are normal. Pupils are equal, round, and reactive to light. Right eye exhibits no discharge. Left eye exhibits no discharge. No scleral icterus.  Neck: Normal range of motion. Neck supple. Muscular tenderness present. No spinous process tenderness present.  Left trapezius tenderness to palpation with palpable spasm. No midline cervical tenderness, step-off, or deformity.  Cardiovascular: Normal rate, regular  rhythm, normal heart sounds and intact distal pulses.   Pulmonary/Chest: Effort normal and breath sounds normal. No respiratory distress. She has no wheezes. She has no rales.  Abdominal: Soft. She exhibits no distension and no mass. There is no tenderness. There is no rebound and no guarding.  Musculoskeletal: Normal range of motion. She exhibits  tenderness. She exhibits no edema.  Diffuse tenderness to palpation of left shoulder. No palpable deformity. No edema or erythema. Full range of motion of left upper extremity. Strength and sensation intact. Distal pulses intact.  Neurological: She is alert and oriented to person, place, and time. She has normal strength. No cranial nerve deficit or sensory deficit.  Skin: Skin is warm and dry. She is not diaphoretic.  Psychiatric: She has a normal mood and affect. Her behavior is normal.  Nursing note and vitals reviewed.   ED Course  Procedures (including critical care time)  Labs Review Labs Reviewed - No data to display  Imaging Review Dg Shoulder Left  06/23/2015   CLINICAL DATA:  Left shoulder pain after falling through a porch.  EXAM: LEFT SHOULDER - 2+ VIEW  COMPARISON:  None.  FINDINGS: No fracture or dislocation identified. Subacromial morphology is type 2 (curved). AC joint unremarkable.  IMPRESSION: 1. No acute radiographic findings.   Electronically Signed   By: Gaylyn Rong M.D.   On: 06/23/2015 11:22     I have personally reviewed and evaluated these images and lab results as part of my medical decision-making.   EKG Interpretation None      MDM   Final diagnoses:  Neck pain  Left arm pain    44 year old presents with left sided neck pain, shoulder pain, and arm pain since Sunday. She states she was on a porch that collapsed on Saturday night, and woke up Sunday morning with pain. She denies hitting her head, LOC, additional injury. She denies fever, chills, headache, lightheadedness, dizziness, vision changes, back pain, chest pain, shortness of breath, abdominal pain, nausea, vomiting.  Patient is afebrile. Vitals signs stable. Mild tenderness to palpation of left trapezius with palpable spasm. No midline cervical tenderness, step-off, or deformity. Diffuse tenderness to palpation of left shoulder. No palpable deformity, edema, or erythema. Full range of  motion of left upper extremity. Strength and sensation intact. Distal pulses intact. Normal neuro exam with no focal deficit.  Will obtain imaging of left shoulder; ibuprofen and robaxin given in the ED. Imaging negative for fracture or dislocation. Symptoms most likely muscular. Will give prescriptions for ibuprofen and robaxin for symptom relief. Patient to follow-up with PCP. Return precautions discussed.   BP 130/81 mmHg  Pulse 63  Temp(Src) 98 F (36.7 C) (Oral)  Resp 16  SpO2 100%  LMP 06/21/2015   Mady Gemma, PA-C 06/23/15 1204  Benjiman Core, MD 06/23/15 1537

## 2015-09-19 ENCOUNTER — Encounter (HOSPITAL_COMMUNITY): Payer: Self-pay | Admitting: Emergency Medicine

## 2015-09-19 ENCOUNTER — Emergency Department (HOSPITAL_COMMUNITY)
Admission: EM | Admit: 2015-09-19 | Discharge: 2015-09-19 | Disposition: A | Discharge status: Home / Self Care | Payer: Medicaid Other | Attending: Emergency Medicine | Admitting: Emergency Medicine

## 2015-09-19 ENCOUNTER — Emergency Department (HOSPITAL_COMMUNITY): Payer: Medicaid Other

## 2015-09-19 DIAGNOSIS — Z79899 Other long term (current) drug therapy: Secondary | ICD-10-CM | POA: Insufficient documentation

## 2015-09-19 DIAGNOSIS — F17200 Nicotine dependence, unspecified, uncomplicated: Secondary | ICD-10-CM | POA: Insufficient documentation

## 2015-09-19 DIAGNOSIS — Y9389 Activity, other specified: Secondary | ICD-10-CM | POA: Diagnosis not present

## 2015-09-19 DIAGNOSIS — Y990 Civilian activity done for income or pay: Secondary | ICD-10-CM | POA: Insufficient documentation

## 2015-09-19 DIAGNOSIS — W1839XA Other fall on same level, initial encounter: Secondary | ICD-10-CM | POA: Insufficient documentation

## 2015-09-19 DIAGNOSIS — Z791 Long term (current) use of non-steroidal anti-inflammatories (NSAID): Secondary | ICD-10-CM | POA: Insufficient documentation

## 2015-09-19 DIAGNOSIS — Z3202 Encounter for pregnancy test, result negative: Secondary | ICD-10-CM | POA: Diagnosis not present

## 2015-09-19 DIAGNOSIS — Y9289 Other specified places as the place of occurrence of the external cause: Secondary | ICD-10-CM | POA: Insufficient documentation

## 2015-09-19 DIAGNOSIS — S8252XA Displaced fracture of medial malleolus of left tibia, initial encounter for closed fracture: Secondary | ICD-10-CM | POA: Insufficient documentation

## 2015-09-19 DIAGNOSIS — R55 Syncope and collapse: Secondary | ICD-10-CM | POA: Diagnosis not present

## 2015-09-19 DIAGNOSIS — S82892A Other fracture of left lower leg, initial encounter for closed fracture: Secondary | ICD-10-CM

## 2015-09-19 DIAGNOSIS — S99912A Unspecified injury of left ankle, initial encounter: Secondary | ICD-10-CM | POA: Diagnosis present

## 2015-09-19 LAB — CBC WITH DIFFERENTIAL/PLATELET
Basophils Absolute: 0 10*3/uL (ref 0.0–0.1)
Basophils Relative: 0 %
EOS ABS: 0.1 10*3/uL (ref 0.0–0.7)
EOS PCT: 1 %
HCT: 36.1 % (ref 36.0–46.0)
Hemoglobin: 12 g/dL (ref 12.0–15.0)
LYMPHS ABS: 2.9 10*3/uL (ref 0.7–4.0)
LYMPHS PCT: 32 %
MCH: 28.4 pg (ref 26.0–34.0)
MCHC: 33.2 g/dL (ref 30.0–36.0)
MCV: 85.5 fL (ref 78.0–100.0)
MONO ABS: 0.7 10*3/uL (ref 0.1–1.0)
MONOS PCT: 8 %
Neutro Abs: 5.3 10*3/uL (ref 1.7–7.7)
Neutrophils Relative %: 59 %
PLATELETS: 279 10*3/uL (ref 150–400)
RBC: 4.22 MIL/uL (ref 3.87–5.11)
RDW: 15 % (ref 11.5–15.5)
WBC: 9.1 10*3/uL (ref 4.0–10.5)

## 2015-09-19 LAB — HCG, QUANTITATIVE, PREGNANCY: hCG, Beta Chain, Quant, S: <1 m[IU]/mL (ref <5)

## 2015-09-19 LAB — URINALYSIS, ROUTINE W REFLEX MICROSCOPIC
BILIRUBIN URINE: NEGATIVE
GLUCOSE, UA: NEGATIVE mg/dL
Ketones, ur: 15 mg/dL — AB
NITRITE: POSITIVE — AB
PH: 6.5 (ref 5.0–8.0)
Protein, ur: NEGATIVE mg/dL
SPECIFIC GRAVITY, URINE: 1.024 (ref 1.005–1.030)

## 2015-09-19 LAB — URINE MICROSCOPIC-ADD ON

## 2015-09-19 LAB — I-STAT CHEM 8, ED
BUN: 17 mg/dL (ref 6–20)
CALCIUM ION: 1.2 mmol/L (ref 1.12–1.23)
CHLORIDE: 106 mmol/L (ref 101–111)
Creatinine, Ser: 1 mg/dL (ref 0.44–1.00)
GLUCOSE: 113 mg/dL — AB (ref 65–99)
HCT: 38 % (ref 36.0–46.0)
Hemoglobin: 12.9 g/dL (ref 12.0–15.0)
Potassium: 3.8 mmol/L (ref 3.5–5.1)
SODIUM: 141 mmol/L (ref 135–145)
TCO2: 23 mmol/L (ref 0–100)

## 2015-09-19 LAB — CBG MONITORING, ED: Glucose-Capillary: 129 mg/dL — ABNORMAL HIGH (ref 65–99)

## 2015-09-19 LAB — I-STAT TROPONIN, ED: TROPONIN I, POC: 0 ng/mL (ref 0.00–0.08)

## 2015-09-19 MED ORDER — MORPHINE SULFATE (PF) 4 MG/ML IV SOLN
4.0000 mg | Freq: Once | INTRAVENOUS | Status: AC
  Administered 2015-09-19: 4 mg via INTRAVENOUS
  Filled 2015-09-19: qty 1

## 2015-09-19 MED ORDER — OXYCODONE-ACETAMINOPHEN 5-325 MG PO TABS: 2.0000 | ORAL_TABLET | ORAL | Status: DC | PRN

## 2015-09-19 MED ORDER — OXYCODONE-ACETAMINOPHEN 5-325 MG PO TABS
1.0000 | ORAL_TABLET | Freq: Once | ORAL | Status: AC
  Administered 2015-09-19: 1 via ORAL
  Filled 2015-09-19: qty 1

## 2015-09-19 MED ORDER — SODIUM CHLORIDE 0.9 % IV BOLUS (SEPSIS)
1000.0000 mL | Freq: Once | INTRAVENOUS | Status: AC
  Administered 2015-09-19: 1000 mL via INTRAVENOUS

## 2015-09-19 NOTE — ED Notes (Signed)
Patient transported to xray/CT. 

## 2015-09-19 NOTE — ED Notes (Addendum)
Patient c/o of left ankle pain and having a LOC while at work.  Patient states she had diaphoresis and was "out for approximately 1 minute".  Patient states "I had to stand in the cooler at work for about 20 minutes still sweat after passing out".  Denies hitting her head.  Patient states she braced her self before she fell.

## 2015-09-19 NOTE — Discharge Instructions (Signed)
You have a broken left ankle.  Please call and follow up closely with orthopedist next week.  Do not bear any weight on the ankle.  Keep it elevated.  Take pain medication as needed. You also need to follow up with your doctor for further evaluation of your recent passing out spell.  Return to ER if you have any concerns.  Syncope Syncope is a medical term for fainting or passing out. This means you lose consciousness and drop to the ground. People are generally unconscious for less than 5 minutes. You may have some muscle twitches for up to 15 seconds before waking up and returning to normal. Syncope occurs more often in older adults, but it can happen to anyone. While most causes of syncope are not dangerous, syncope can be a sign of a serious medical problem. It is important to seek medical care.  CAUSES  Syncope is caused by a sudden drop in blood flow to the brain. The specific cause is often not determined. Factors that can bring on syncope include:  Taking medicines that lower blood pressure.  Sudden changes in posture, such as standing up quickly.  Taking more medicine than prescribed.  Standing in one place for too long.  Seizure disorders.  Dehydration and excessive exposure to heat.  Low blood sugar (hypoglycemia).  Straining to have a bowel movement.  Heart disease, irregular heartbeat, or other circulatory problems.  Fear, emotional distress, seeing blood, or severe pain. SYMPTOMS  Right before fainting, you may:  Feel dizzy or light-headed.  Feel nauseous.  See all white or all black in your field of vision.  Have cold, clammy skin. DIAGNOSIS  Your health care provider will ask about your symptoms, perform a physical exam, and perform an electrocardiogram (ECG) to record the electrical activity of your heart. Your health care provider may also perform other heart or blood tests to determine the cause of your syncope which may include:  Transthoracic echocardiogram  (TTE). During echocardiography, sound waves are used to evaluate how blood flows through your heart.  Transesophageal echocardiogram (TEE).  Cardiac monitoring. This allows your health care provider to monitor your heart rate and rhythm in real time.  Holter monitor. This is a portable device that records your heartbeat and can help diagnose heart arrhythmias. It allows your health care provider to track your heart activity for several days, if needed.  Stress tests by exercise or by giving medicine that makes the heart beat faster. TREATMENT  In most cases, no treatment is needed. Depending on the cause of your syncope, your health care provider may recommend changing or stopping some of your medicines. HOME CARE INSTRUCTIONS  Have someone stay with you until you feel stable.  Do not drive, use machinery, or play sports until your health care provider says it is okay.  Keep all follow-up appointments as directed by your health care provider.  Lie down right away if you start feeling like you might faint. Breathe deeply and steadily. Wait until all the symptoms have passed.  Drink enough fluids to keep your urine clear or pale yellow.  If you are taking blood pressure or heart medicine, get up slowly and take several minutes to sit and then stand. This can reduce dizziness. SEEK IMMEDIATE MEDICAL CARE IF:   You have a severe headache.  You have unusual pain in the chest, abdomen, or back.  You are bleeding from your mouth or rectum, or you have black or tarry stool.  You have  an irregular or very fast heartbeat.  You have pain with breathing.  You have repeated fainting or seizure-like jerking during an episode.  You faint when sitting or lying down.  You have confusion.  You have trouble walking.  You have severe weakness.  You have vision problems. If you fainted, call your local emergency services (911 in [place] Do not drive yourself to the hospital.    This  information is not intended to replace advice given to you by your health care provider. Make sure you discuss any questions you have with your health care provider.   Document Released: 08/30/2005 Document Revised: 01/14/2015 Document Reviewed: 10/29/2011 Elsevier Interactive Patient Education 2016 [employer] Inc[employer]  Tibial and Fibular Fracture, Adult Tibial and fibular fracture is a break in the bones of your lower leg (tibia and fibula). The tibia is the larger of these two bones. The fibula is the smaller of the two bones. It is on the outer side of your leg.  CAUSES  Low-energy injuries, such as a fall from ground level.  High-energy injuries, such as motor vehicle injuries, gunshot wounds, or high-speed sports collisions. RISK FACTORS  Jumping activities.  Repetitive stress, such as long-distance running.  Participation in sports.  Osteoporosis.  Advanced age. SIGNS AND SYMPTOMS  Pain.  Swelling.  Inability to put weight on your injured leg.  Bone deformities at the site of your injury.  Bruising. DIAGNOSIS  Tibial and fibular fractures are diagnosed with the use of X-ray exams. TREATMENT  If you have a simple fracture of these two bones, they can be treated with simple immobilization. A cast or splint will be used on your leg to keep it from moving while it heals. Then you can begin range-of-motion exercises to regain your knee motion. HOME CARE INSTRUCTIONS   Apply ice to your leg:  Put ice in a plastic bag.  Place a towel between your skin and the bag.  Leave the ice on for 20 minutes, 2-3 times a day.  If you have a plaster or fiberglass cast:  Do not try to scratch the skin under the cast using sharp or pointed objects.  Check the skin around the cast every day. You may put lotion on any red or sore areas.  Keep your cast dry and clean.  If you have a plaster splint:  Wear the splint as directed.  You may loosen the elastic around the splint if your  toes become numb, tingle, or turn cold or blue.  Do not put pressure on any part of your cast or splint until it is fully hardened, because it may deform.  Your cast or splint can be protected during bathing with a plastic bag. Do not lower the cast or splint into water.  Use crutches as directed.  Only take over-the-counter or prescription medicines for pain, discomfort, or fever as directed by your health care provider.  Follow all instructions given to you by your health care provider.  Make and keep all follow-up appointments. SEEK MEDICAL CARE IF:  Your pain is becoming worse rather than better or is not controlled with medicines.  You have increased swelling or redness in the foot.  You begin to lose feeling in your foot or toes. SEEK IMMEDIATE MEDICAL CARE IF:  You develop a cold or blue foot or toes on the injured side.  You develop severe pain in your injured leg, especially if the pain is increased with movement of your toes. MAKE SURE YOU:  Understand these instructions.  Will watch your condition.  Will get help right away if you are not doing well or get worse.   This information is not intended to replace advice given to you by your health care provider. Make sure you discuss any questions you have with your health care provider.   Document Released: 05/22/2002 Document Revised: 01/14/2015 Document Reviewed: 04/11/2013 Elsevier Interactive Patient Education [employer]

## 2015-09-19 NOTE — Progress Notes (Signed)
Orthopedic Tech Progress Note Patient Details:  Hannah May [birth year] [id number]  Ortho Devices Type of Ortho Device: Ace wrap, Crutches, Stirrup splint, Post (short leg) splint Ortho Device/Splint Location: lle Ortho Device/Splint Interventions: Ordered, Application L and U applied to Hannah May  Hannah May 09/19/2015, 9:23 PM

## 2015-09-19 NOTE — ED Provider Notes (Signed)
History  By signing my name below, I, Karle Plumber, attest that this documentation has been prepared under the direction and in the presence of Fayrene Helper, PA-C. Electronically Signed: Karle Plumber, ED Scribe. 09/19/2015. 8:34 PM  Chief Complaint  Patient presents with  . Ankle Injury    left  . Loss of Consciousness   The history is provided by the patient and medical records. No language interpreter was used.    HPI Comments:  Hannah May is a 45 y.o. female who presents to the Emergency Department complaining of a syncopal episode that occurred about 5.5 hours ago while at work. Pt works at [employer].  She report earlier in the day she experienced hot flash, and went int the cooler to cool herself.  She felt better.  Later in the day her hotflash came back, she then went to the cooler but passes out before she can get to it. Pt states she was probably on the ground for a approximately one minute before she was conscious again. She states she began having hot flashes and tried to make her way to the cooler when she passed out. She reports moderate left ankle pain secondary to twisting it when she fell. She has not done anything to treat her symptoms. She's unable to ambulate.  Report sharp 10/10 pain radiates up to her leg.  She denies modifying factors. She denies light-headedness, dizziness, nausea, vomiting. She denies PMHx of DM or family h/o early cardiac death. Pt's LMP is 10-06-2023.  History reviewed. No pertinent past medical history. History reviewed. No pertinent past surgical history. No family history on file. Social History  Substance Use Topics  . Smoking status: Current Some Day Smoker  . Smokeless tobacco: None  . Alcohol Use: Yes   OB History    No data available     Review of Systems  Gastrointestinal: Negative for nausea and vomiting.  Musculoskeletal: Positive for joint swelling and arthralgias.  Neurological: Positive for syncope. Negative for facial  asymmetry and light-headedness.    Allergies  Review of patient's allergies indicates no known allergies.  Home Medications   Prior to Admission medications   Medication Sig Start Date End Date Taking? Authorizing Provider  acyclovir (ZOVIRAX) 400 MG tablet Take 1 tablet (400 mg total) by mouth 4 (four) times daily. 06/09/15   Hope Orlene Och, NP  ibuprofen (ADVIL,MOTRIN) 800 MG tablet Take 1 tablet (800 mg total) by mouth 3 (three) times daily. 06/23/15   Mady Gemma, PA-C  loratadine (CLARITIN) 10 MG tablet Take 1 tablet (10 mg total) by mouth daily. 06/09/15   Hope Orlene Och, NP  methocarbamol (ROBAXIN) 500 MG tablet Take 1 tablet (500 mg total) by mouth 2 (two) times daily. 06/23/15   Mady Gemma, PA-C  naproxen (NAPROSYN) 375 MG tablet Take 1 tablet (375 mg total) by mouth 2 (two) times daily. 06/09/15   Hope Orlene Och, NP   Triage Vitals: BP 106/67 mmHg  Pulse 83  Temp(Src) 98.2 F (36.8 C) (Oral)  Resp 18  SpO2 100% Physical Exam  Constitutional: She is oriented to person, place, and time. She appears well-developed and well-nourished.  HENT:  Head: Normocephalic and atraumatic.  No scalp tenderness.  No hemotympanum, septal hematoma, malocclusion, or midface tenderness.  No battle's sign.   Eyes: Conjunctivae and EOM are normal. Pupils are equal, round, and reactive to light.  Neck: Normal range of motion.  Cardiovascular: Normal rate and intact distal pulses.   Left  DP pulses intact.  Pulmonary/Chest: Effort normal.  Abdominal: Soft. There is no tenderness.  Musculoskeletal: Normal range of motion.  Left ankle with tenderness to medial lateral malleolus region. Edema noted to lateral aspect. Decreased ROM secondary to pain. No crepitus. L knee and L hip are nontender on exam.    Neurological: She is alert and oriented to person, place, and time.  Skin: Skin is warm and dry.  Psychiatric: She has a normal mood and affect. Her behavior is normal.  Nursing note and  vitals reviewed.   ED Course  Procedures (including critical care time) DIAGNOSTIC STUDIES: Oxygen Saturation is 100% on RA, normal by my interpretation.   COORDINATION OF CARE: 7:38 PM- Will order labs and X-Ray of left ankle. Pt verbalizes understanding and agrees to plan.  Medications  sodium chloride 0.9 % bolus 1,000 mL (1,000 mLs Intravenous New Bag/Given 09/19/15 1956)  morphine 4 MG/ML injection 4 mg (4 mg Intravenous Given 09/19/15 1957)    Labs Review Labs Reviewed  I-STAT CHEM 8, ED - Abnormal; Notable for the following:    Glucose, Bld 113 (*)    All other components within normal limits  CBG MONITORING, ED - Abnormal; Notable for the following:    Glucose-Capillary 129 (*)    All other components within normal limits  CBC WITH DIFFERENTIAL/PLATELET  URINALYSIS, ROUTINE W REFLEX MICROSCOPIC (NOT AT [facility])  I-STAT TROPOININ, ED  POC URINE PREG, ED    Imaging Review Dg Ankle Complete Left  09/19/2015  CLINICAL DATA:  Syncope and fall today, left ankle pain EXAM: LEFT ANKLE COMPLETE - 3+ VIEW COMPARISON:  None. FINDINGS: Minimally displaced avulsion fracture of the medial malleolus. Minimally displaced oblique fracture distal shaft of the fibula. No significant disruption of the mortise. Posterior malleolus appears to be intact. Small joint effusion. IMPRESSION: Fractures of the medial malleolus and of the distal fibula. Electronically Signed   By: Esperanza Heiraymond  Rubner M.D.   On: 09/19/2015 20:18   Ct Head Wo Contrast  09/19/2015  CLINICAL DATA:  Fall.  Partial loss of consciousness. EXAM: CT HEAD WITHOUT CONTRAST TECHNIQUE: Contiguous axial images were obtained from the base of the skull through the vertex without intravenous contrast. COMPARISON:  None. FINDINGS: Ventricles are normal in size and configuration. There are no parenchymal masses or mass effect. There are no areas of abnormal parenchymal attenuation. No evidence of an infarct. There are no extra-axial masses or abnormal  fluid collections. There is no intracranial hemorrhage. Visualized sinuses and mastoid air cells are clear. There are small chronic defects in the parietal bones consistent with remote burr holes. IMPRESSION: 1. No intracranial abnormality. Electronically Signed   By: Amie Portlandavid  Ormond M.D.   On: 09/19/2015 20:26   I have personally reviewed and evaluated these images and lab results as part of my medical decision-making.   EKG Interpretation   Date/Time:  Friday September 19 2015 19:58:39 EST Ventricular Rate:  72 PR Interval:  166 QRS Duration: 80 QT Interval:  396 QTC Calculation: 433 R Axis:   74 Text Interpretation:  Normal sinus rhythm Normal ECG Confirmed by KNOTT  MD, DANIEL [phone]([id number]) on 09/19/2015 8:06:00 PM      Date: 09/19/2015  Rate:72  Rhythm: normal sinus rhythm  QRS Axis: normal  Intervals: normal  ST/T Wave abnormalities: normal  Conduction Disutrbances: none  Narrative Interpretation:   Old EKG Reviewed: No significant changes noted     MDM   Final diagnoses:  Ankle fracture, left, closed, initial encounter  Syncope and collapse    BP 131/76 mmHg  Pulse 75  Temp(Src) 98.2 F (36.8 C) (Oral)  Resp 16  SpO2 100%  LMP 09/10/2015   I personally performed the services described in this documentation, which was scribed in my presence. The recorded information has been reviewed and is accurate.     8:49 PM Pt had a syncopal episode earlier today, injuring her L ankle from the fall.  Xray demonstrates fractures of the medial malleolus and of the distal fibula.  This is a closed injury.  Pt is NVI.  Will apply ankle splint and provide crutches.  Syncope work up initiated, pain medication given.    10:09 PM Vital signs stable. Normal CBG. Normal orthostatic vital sign. No evidence of anemia. EKG without acute ischemic changes are concerning arrhythmia. Troponins negative. Head CT scan without any intracranial abnormalities. Left ankle x-ray as mentioned above.  Patient will need to follow-up with her PCP for further evaluation of her syncopal episode. She would also need to follow-up with orthopedist next week for further management of her ankle fracture. Rice therapy discussed. Pain medication prescribed. Strict return precaution given. Patient voiced understanding and agrees with plan.  11:24 PM UA with positive nitrite however pt denies any urinary sxs.  Therefore, urine culture sent.  preg test is negative.  Pt currently felt better, stable for discharge.  Emphasize on non weight bearing and f/u with ortho for further management and PCP for syncope work up.    Fayrene Helper, PA-C 09/19/15 [id number]  Fayrene Helper, PA-C 09/19/15 [id number]  Lyndal Pulley, MD 09/21/15 [phone]

## 2015-09-22 LAB — URINE CULTURE

## 2015-09-23 NOTE — Progress Notes (Signed)
ED Antimicrobial Stewardship Positive Culture Follow Up   Hannah May is an 45 y.o. female who [facility] with a chief complaint of  Chief Complaint  Patient presents with  . Ankle Injury    left  . Loss of Consciousness    Recent Results (from the past 720 hour(s))  Urine culture     Status: None   Collection Time: 09/19/15  9:58 PM  Result Value Ref Range Status   Specimen Description URINE, CLEAN CATCH  Final   Special Requests NONE  Final   Culture   Final    >=100,000 COLONIES/mL STAPHYLOCOCCUS SPECIES (COAGULASE NEGATIVE)   Report Status 09/22/2015 FINAL  Final   Organism ID, Bacteria STAPHYLOCOCCUS SPECIES (COAGULASE NEGATIVE)  Final      Susceptibility   Staphylococcus species (coagulase negative) - MIC*    CIPROFLOXACIN <=0.5 SENSITIVE Sensitive     GENTAMICIN <=0.5 SENSITIVE Sensitive     NITROFURANTOIN <=16 SENSITIVE Sensitive     OXACILLIN >=4 RESISTANT Resistant     TETRACYCLINE 4 SENSITIVE Sensitive     VANCOMYCIN <=0.5 SENSITIVE Sensitive     TRIMETH/SULFA <=10 SENSITIVE Sensitive     CLINDAMYCIN <=0.25 SENSITIVE Sensitive     RIFAMPIN <=0.5 SENSITIVE Sensitive     Inducible Clindamycin NEGATIVE Sensitive     * >=100,000 COLONIES/mL STAPHYLOCOCCUS SPECIES (COAGULASE NEGATIVE)    Asymptomatic bacteriuria, no treatment indicated at this time  ED Provider: Hebert SohoHanna Patel, PA-C   Sherron MondayAubrey N. Chee Kinslow, PharmD Clinical Pharmacy Resident Pager: [phone] 09/23/2015 9:11 AM

## 2015-09-24 ENCOUNTER — Telehealth (HOSPITAL_COMMUNITY): Payer: Self-pay

## 2015-09-24 NOTE — Telephone Encounter (Signed)
Post ED Visit - Positive Culture Follow-up  Culture report reviewed by antimicrobial stewardship pharmacist:  []  Enzo BiNathan Batchelder, Pharm.D. []  Celedonio MiyamotoJeremy Frens, [address]Ph[address] BCPS []  Garvin FilaMike Maccia, Pharm.D. []  Georgina PillionElizabeth Martin, Pharm.D., BCPS []  [place]h Pham, [address]Ph[address]D., BCPS, AAHIVP []  Estella HuskMichelle Turner, Pharm.D., BCPS, AAHIVP []  Tennis Mustassie Stewart, Pharm.D. []  Sherle Poeob Vincent, [address]Jacinto Reap. X  Aubrey Jones, Pharm.D.  Positive urine culture, >/= 100,000 colonies -> staph. species Chart reviewed by H. Patel-Mills PA Ok  Arvid RightClark, Dodger Sinning Dorn 09/24/2015, 10:57 AM

## 2016-02-03 IMAGING — CR DG SHOULDER 2+V*L*
3 series · 3 of 3 positions shown · non-contrast
Comparison: None.

CLINICAL DATA: Left shoulder pain after falling through a porch.

EXAM:
LEFT SHOULDER - 2+ VIEW

[w shoulder external left]
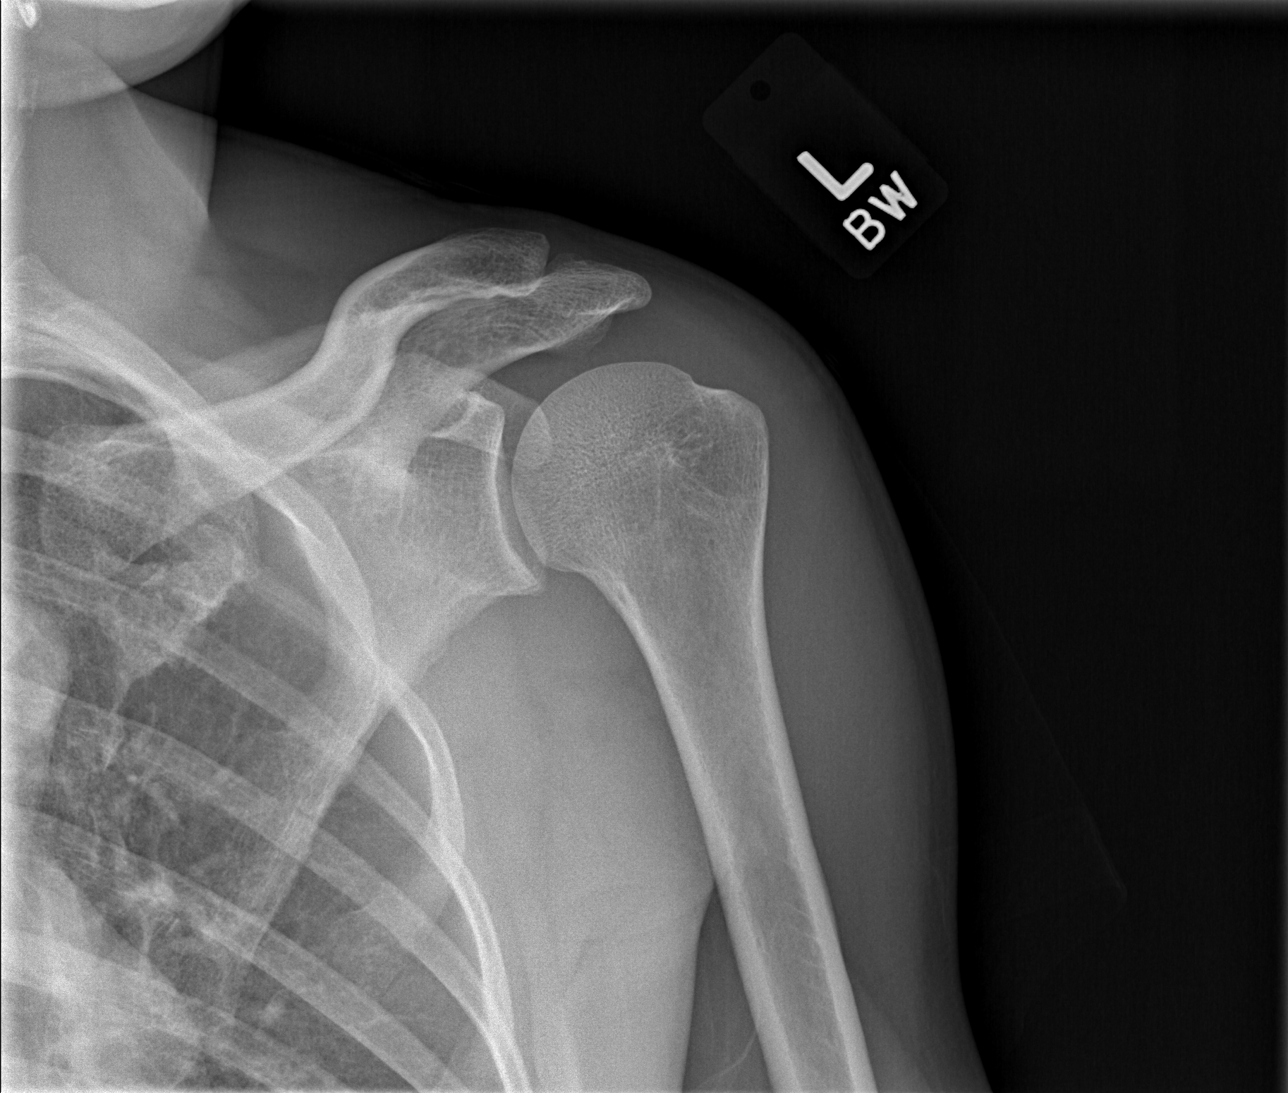

[w shoulder y-view left]
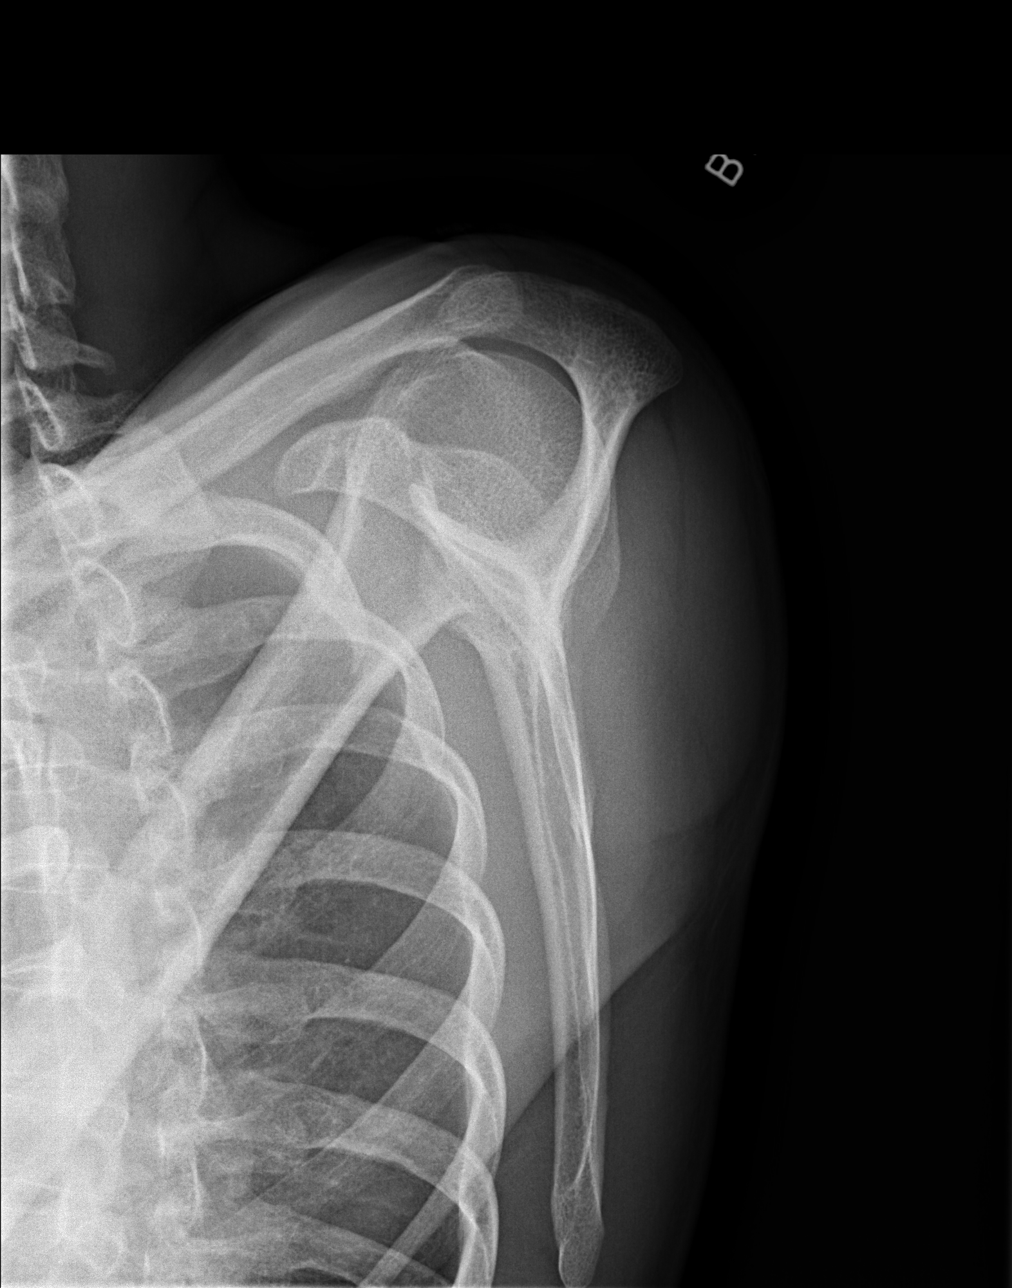

[x shoulder axillary left]
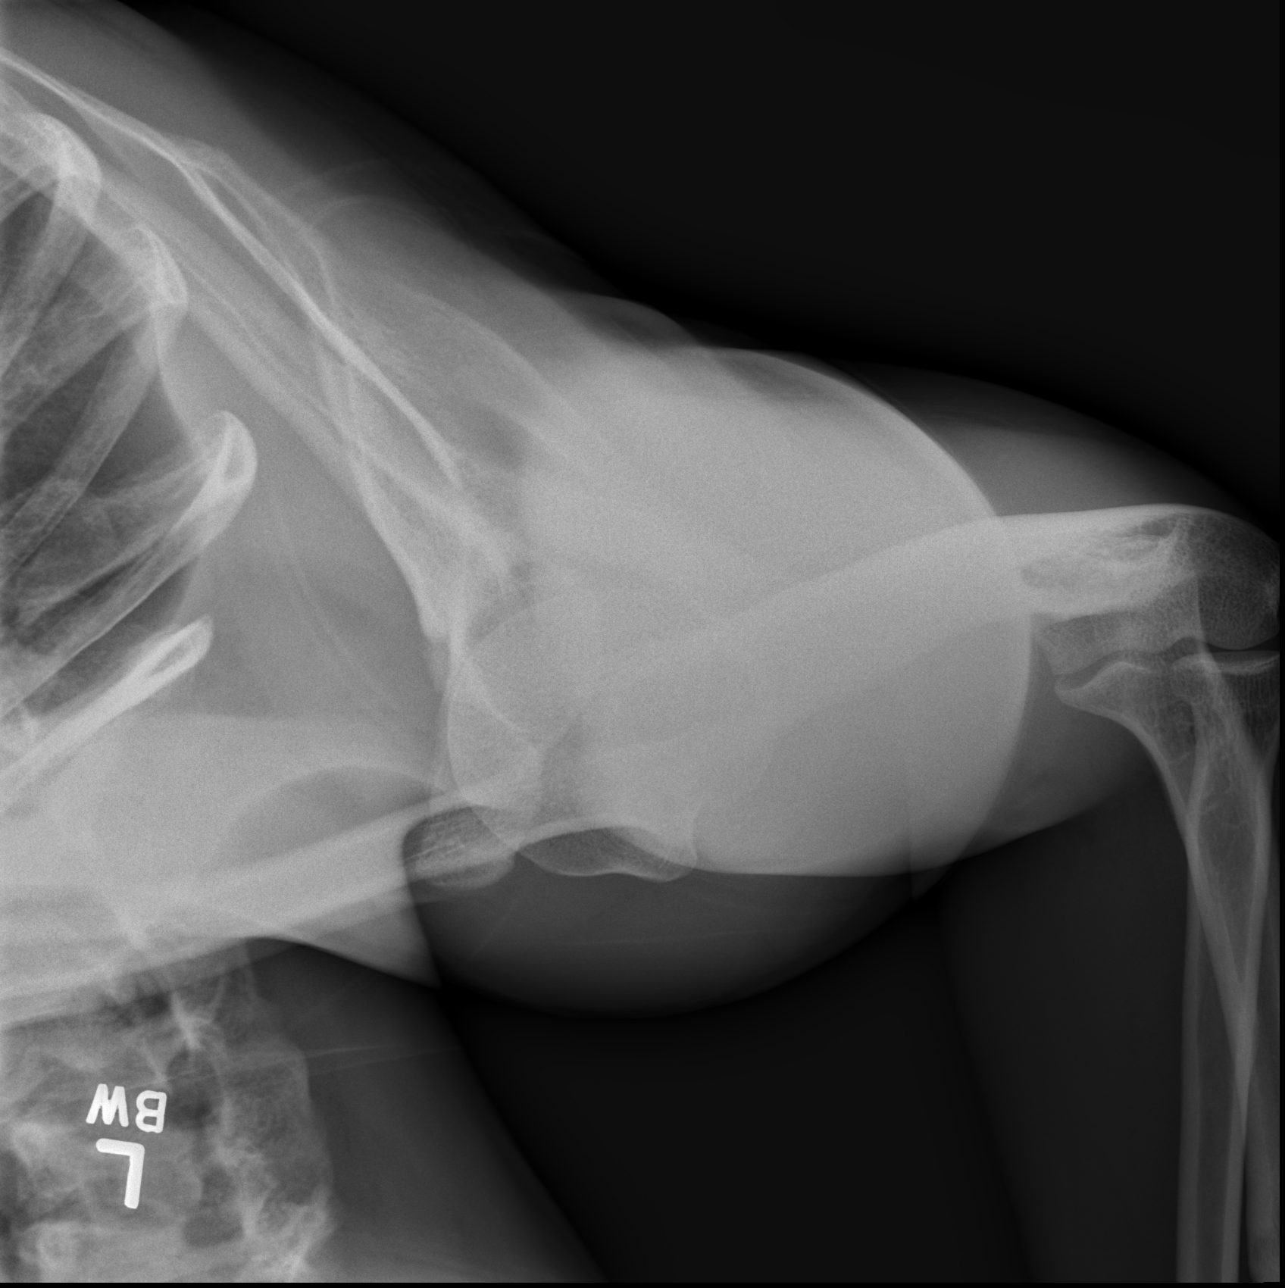

[3 of 3 positions shown; findings below may reference images not displayed]

FINDINGS: No fracture or dislocation identified. Subacromial morphology is
type 2 (curved). AC joint unremarkable.
IMPRESSION: 1. No acute radiographic findings.

## 2017-06-13 ENCOUNTER — Encounter (HOSPITAL_COMMUNITY): Payer: Self-pay

## 2017-06-13 ENCOUNTER — Emergency Department (HOSPITAL_COMMUNITY)
Admission: EM | Admit: 2017-06-13 | Discharge: 2017-06-13 | Disposition: A | Discharge status: Home / Self Care | Payer: Medicaid Other | Attending: Emergency Medicine | Admitting: Emergency Medicine

## 2017-06-13 DIAGNOSIS — F17210 Nicotine dependence, cigarettes, uncomplicated: Secondary | ICD-10-CM | POA: Diagnosis not present

## 2017-06-13 DIAGNOSIS — W57XXXA Bitten or stung by nonvenomous insect and other nonvenomous arthropods, initial encounter: Secondary | ICD-10-CM | POA: Diagnosis not present

## 2017-06-13 DIAGNOSIS — L0291 Cutaneous abscess, unspecified: Secondary | ICD-10-CM | POA: Diagnosis not present

## 2017-06-13 DIAGNOSIS — Z79899 Other long term (current) drug therapy: Secondary | ICD-10-CM | POA: Diagnosis not present

## 2017-06-13 DIAGNOSIS — R21 Rash and other nonspecific skin eruption: Secondary | ICD-10-CM | POA: Diagnosis present

## 2017-06-13 MED ORDER — HYDROCODONE-ACETAMINOPHEN 5-325 MG PO TABS
1.0000 | ORAL_TABLET | Freq: Once | ORAL | Status: AC
  Administered 2017-06-13: 1 via ORAL
  Filled 2017-06-13: qty 1

## 2017-06-13 MED ORDER — ACETAMINOPHEN 325 MG PO TABS
650.0000 mg | ORAL_TABLET | Freq: Once | ORAL | Status: AC
  Administered 2017-06-13: 650 mg via ORAL
  Filled 2017-06-13: qty 2

## 2017-06-13 MED ORDER — LIDOCAINE HCL (PF) 1 % IJ SOLN
10.0000 mL | Freq: Once | INTRAMUSCULAR | Status: AC
  Administered 2017-06-13: 5 mL via INTRADERMAL
  Filled 2017-06-13: qty 10

## 2017-06-13 MED ORDER — CEPHALEXIN 500 MG PO CAPS
500.0000 mg | ORAL_CAPSULE | Freq: Four times a day (QID) | ORAL | 0 refills | Status: DC
Start: 2017-06-13 — End: 2020-05-05

## 2017-06-13 NOTE — ED Provider Notes (Signed)
[facility]-EMERGENCY DEPT Provider Note   CSN: [id number] Arrival date & time: 06/13/17  1359     History   Chief Complaint Chief Complaint  Patient presents with  . Insect Bite    HPI Hannah May is a 46 y.o. female who presents with an area of pain, redness, swelling to the left lateral chest that has been ongoing for the last 5 days. Patient reports probably 5 days ago, she was bit by an insect. Patient reports that since she has had redness and swelling to the area. Patient reports that the area has been warm and red for the last 2 days. Patient states that she has not taken anything for the pain. Patient denies any fevers, chest pain, difficulty breathing  The history is provided by the patient.    History reviewed. No pertinent past medical history.  There are no active problems to display for this patient.   History reviewed. No pertinent surgical history.  OB History    No data available       Home Medications    Prior to Admission medications   Medication Sig Start Date End Date Taking? Authorizing Provider  acyclovir (ZOVIRAX) 400 MG tablet Take 1 tablet (400 mg total) by mouth 4 (four) times daily. 06/09/15   Janne Napoleon, NP  cephALEXin (KEFLEX) 500 MG capsule Take 1 capsule (500 mg total) by mouth 4 (four) times daily. 06/13/17   Maxwell Caul, PA-C  ibuprofen (ADVIL,MOTRIN) 800 MG tablet Take 1 tablet (800 mg total) by mouth 3 (three) times daily. 06/23/15   Mady Gemma, PA-C  loratadine (CLARITIN) 10 MG tablet Take 1 tablet (10 mg total) by mouth daily. 06/09/15   Janne Napoleon, NP  methocarbamol (ROBAXIN) 500 MG tablet Take 1 tablet (500 mg total) by mouth 2 (two) times daily. 06/23/15   Mady Gemma, PA-C  naproxen (NAPROSYN) 375 MG tablet Take 1 tablet (375 mg total) by mouth 2 (two) times daily. 06/09/15   Janne Napoleon, NP  oxyCODONE-acetaminophen (PERCOCET/ROXICET) 5-325 MG tablet Take 2 tablets by mouth every 4 (four) hours as needed  for severe pain. 09/19/15   Fayrene Helper, PA-C    Family History History reviewed. No pertinent family history.  Social History Social History  Substance Use Topics  . Smoking status: Current Some Day Smoker    Packs/day: 0.50    Types: Cigarettes  . Smokeless tobacco: Never Used  . Alcohol use Yes     Comment: weekends only     Allergies   Patient has no known allergies.   Review of Systems Review of Systems  Constitutional: Negative for fever.  Respiratory: Negative for shortness of breath.   Cardiovascular: Negative for chest pain.  Skin: Positive for color change and wound.     Physical Exam Updated Vital Signs BP (!) 124/94 (BP Location: Right Arm)   Pulse 65   Temp 98.9 F (37.2 C) (Oral)   Resp 20   Ht  (1.575 m)   Wt 56.7 kg (125 lb)   LMP 05/28/2017   SpO2 99%   BMI 22.86 kg/m   Physical Exam  Constitutional: She appears well-developed and well-nourished.  Appears uncomfortable but no acute distress   HENT:  Head: Normocephalic and atraumatic.  Eyes: Conjunctivae and EOM are normal. Right eye exhibits no discharge. Left eye exhibits no discharge. No scleral icterus.  Pulmonary/Chest: Effort normal.  Neurological: She is alert.  Skin: Skin is warm and dry.  4 cm area of warmth, erythema and induration with a small, central area of fluctuance.   Psychiatric: She has a normal mood and affect. Her speech is normal and behavior is normal.  Nursing note and vitals reviewed.    ED Treatments / Results  Labs (all labs ordered are listed, but only abnormal results are displayed) Labs Reviewed - No data to display  EKG  EKG Interpretation None       Radiology No results found.  Procedures .Marland KitchenIncision and Drainage Date/Time: 06/13/2017 5:01 PM Performed by: Graciella Freer A Authorized by: Graciella Freer A   Consent:    Consent obtained:  Verbal   Consent given by:  Patient   Risks discussed:  Incomplete drainage and pain    Alternatives discussed:  No treatment Location:    Type:  Abscess   Location:  Trunk   Trunk location:  Chest (left lateral chest) Pre-procedure details:    Skin preparation:  Betadine Anesthesia (see MAR for exact dosages):    Anesthesia method:  Local infiltration   Local anesthetic:  Lidocaine 1% w/o epi Procedure type:    Complexity:  Simple Procedure details:    Needle aspiration: yes     Needle size:  22 G   Incision types:  Single straight   Scalpel blade:  11   Wound management:  Probed and deloculated and irrigated with saline   Drainage:  Purulent and bloody   Drainage amount:  Moderate   Wound treatment:  Wound left open Post-procedure details:    Patient tolerance of procedure:  Tolerated well, no immediate complications Comments:     Once the wound was properly anesthetized, needle aspiration showed small purulent drainage. Even purulent drainage, opted to open up the abscess further for complete drainage. Patient had moderate purulent drainage expressed from the site.   (including critical care time)  Medications Ordered in ED Medications  acetaminophen (TYLENOL) tablet 650 mg (650 mg Oral Given 06/13/17 1626)  lidocaine (PF) (XYLOCAINE) 1 % injection 10 mL (5 mLs Intradermal Given by Other 06/13/17 1625)  HYDROcodone-acetaminophen (NORCO/VICODIN) 5-325 MG per tablet 1 tablet (1 tablet Oral Given 06/13/17 1728)     Initial Impression / Assessment and Plan / ED Course  I have reviewed the triage vital signs and the nursing notes.  Pertinent labs & imaging results that were available during my care of the patient were reviewed by me and considered in my medical decision making (see chart for details).     46 year old female who presents with redness, pain, swelling noted to the left lateral chest wall. It started 5 days ago after an insect bite. There has been warm and hot. No fevers, chest pain, difficulty breathing. Patient is afebrile, non-toxic appearing,  sitting comfortably on examination table. Vital signs reviewed and stable. Physical exam is concerning with cellulitis with a central abscess. We'll plan to obtain bedside ultrasound for further evaluation.  Bedside ultrasound performed. Concern for cellulitis with questionable abscess. Discussed with patient regarding questionable abscess on ultrasound. We'll plan to needle aspirate and if any purulent drainage is expressed, we will continue to open up.  I&D as documented above. Patient tolerated procedure well. Given considerable warmth, induration, erythema surrounding the small central abscess, concern for cellulitis. Abscess was not large enough to warrant packing. Will plan to start patient on antibiotics. Encourage patient on the use of warm soaks to continue to express drainage. Explained to patient that she'll need a wound recheck in approximately 2 days. Instructed patient  to follow-up with ED in 2 days. Strict return precautions discussed. Patient expresses understanding and agreement to plan.   EMERGENCY DEPARTMENT US SOFT TISSUE INTERPRETATION "Study: Limited Soft Tissue Ultrasound"  INDICATIONS: Pain and Soft tissue infection Multiple views of the body part were obtained in real-time with a multi-frequency linear probe  PERFORMED BY: Myself IMAGES ARCHIVED?: No SIDE:Left BODY PART:Chest wall INTERPRETATION:  Abcess present and Cellulitis present   Final Clinical Impressions(s) / ED Diagnoses   Final diagnoses:  Insect bite, initial encounter  Abscess    New Prescriptions Discharge Medication List as of 06/13/2017  5:08 PM    START taking these medications   Details  cephALEXin (KEFLEX) 500 MG capsule Take 1 capsule (500 mg total) by mouth 4 (four) times daily., Starting Mon 06/13/2017, Print         Maxwell Caul, PA-C 06/13/17 2344    Dione Booze, MD 06/13/17 2356

## 2017-06-13 NOTE — ED Triage Notes (Signed)
Patient states she was bit by something 4 days on the left rib cage area. Area is red and swollen.

## 2017-06-13 NOTE — Discharge Instructions (Signed)
Apply warm compresses to the area or soak the area in warm water to help continue express drainage.   Keep the wound clean and dry. Gently wash the wound with soap and water and make sure to pat it dry.    Take antibiotic and complete the entire course.   You can take Tylenol or Ibuprofen as directed for pain. You can alternate Tylenol and Ibuprofen every 4 hours. If you take Tylenol at 1pm, then you can take Ibuprofen at 5pm. Then you can take Tylenol again at 9pm.   Followup with your doctor or return to the Emergency Department  in 2 days for wound recheck.  Return to the Emergency Department if you experienced any worsening/spreading redness or swelling, fever, worsening pain, or any other worsening or concerning symptoms.

## 2017-06-13 NOTE — ED Notes (Signed)
Using sterile technique, applied tegaform over the open area of the abscess. Secured with surgical tape. Pt tolerated it well.

## 2017-07-07 ENCOUNTER — Emergency Department (HOSPITAL_COMMUNITY)
Admission: EM | Admit: 2017-07-07 | Discharge: 2017-07-07 | Disposition: A | Discharge status: Home / Self Care | Payer: Medicaid Other | Attending: Emergency Medicine | Admitting: Emergency Medicine

## 2017-07-07 ENCOUNTER — Emergency Department (HOSPITAL_COMMUNITY): Payer: Medicaid Other

## 2017-07-07 ENCOUNTER — Encounter (HOSPITAL_COMMUNITY): Payer: Self-pay | Admitting: *Deleted

## 2017-07-07 DIAGNOSIS — F17210 Nicotine dependence, cigarettes, uncomplicated: Secondary | ICD-10-CM | POA: Diagnosis not present

## 2017-07-07 DIAGNOSIS — Z79899 Other long term (current) drug therapy: Secondary | ICD-10-CM | POA: Insufficient documentation

## 2017-07-07 DIAGNOSIS — R059 Cough, unspecified: Secondary | ICD-10-CM

## 2017-07-07 DIAGNOSIS — J069 Acute upper respiratory infection, unspecified: Secondary | ICD-10-CM

## 2017-07-07 DIAGNOSIS — B9789 Other viral agents as the cause of diseases classified elsewhere: Secondary | ICD-10-CM | POA: Diagnosis not present

## 2017-07-07 DIAGNOSIS — R05 Cough: Secondary | ICD-10-CM | POA: Diagnosis present

## 2017-07-07 MED ORDER — BENZONATATE 100 MG PO CAPS: 100.0000 mg | ORAL_CAPSULE | Freq: Three times a day (TID) | ORAL | 0 refills | Status: DC

## 2017-07-07 MED ORDER — ALBUTEROL SULFATE HFA 108 (90 BASE) MCG/ACT IN AERS: 2.0000 | INHALATION_SPRAY | RESPIRATORY_TRACT | 0 refills | Status: DC | PRN

## 2017-07-07 NOTE — ED Triage Notes (Signed)
The pt is c/o a cough since yesterday  She is a smoker no asthma history.  Chest congestion.. No pain unless she coughs.  She wants something for her cough  lmp oct 9th

## 2017-07-07 NOTE — ED Provider Notes (Signed)
[facility] EMERGENCY DEPARTMENT Provider Note   CSN: [id number] Arrival date & time: 07/07/17  1940     History   Chief Complaint Chief Complaint  Patient presents with  . Cough    HPI Hannah May is a 46 y.o. female who presents with 2 days of cough, rhinorrhea and intermittent nasal congestion. She reports that cough is productive with phlegm. She denies any hemoptysis. She took one dose of DayQuil and did not have any improvement in symptoms. Patient states that she went to work but continued to cough, prompting ED visit. Patient also reports some associated chest soreness that only occurs when she coughs. She denies any other chest pain. Patient does note she has some difficulty breathing when she gets a coughing fit but otherwise no other difficulty breathing. Patient has not taken any other medications for symptoms. Patient is a current smoker and states that she smokes 10 cigarettes a day. She denies any history of asthma or COPD. Patient denies fever, chest pain, dental pain, nausea/vomiting, sore throat. She denies any OCP use, recent immobilization, prior history of DVT/PE, recent surgery, leg swelling, or long travel.  The history is provided by the patient.    History reviewed. No pertinent past medical history.  There are no active problems to display for this patient.   History reviewed. No pertinent surgical history.  OB History    No data available       Home Medications    Prior to Admission medications   Medication Sig Start Date End Date Taking? Authorizing Provider  acyclovir (ZOVIRAX) 400 MG tablet Take 1 tablet (400 mg total) by mouth 4 (four) times daily. 06/09/15   Janne Napoleon, NP  albuterol (PROVENTIL HFA;VENTOLIN HFA) 108 (90 Base) MCG/ACT inhaler Inhale 2 puffs into the lungs every 4 (four) hours as needed for wheezing or shortness of breath. 07/07/17   Maxwell Caul, PA-C  benzonatate (TESSALON) 100 MG capsule Take 1  capsule (100 mg total) by mouth every 8 (eight) hours. 07/07/17   Maxwell Caul, PA-C  cephALEXin (KEFLEX) 500 MG capsule Take 1 capsule (500 mg total) by mouth 4 (four) times daily. 06/13/17   Maxwell Caul, PA-C  ibuprofen (ADVIL,MOTRIN) 800 MG tablet Take 1 tablet (800 mg total) by mouth 3 (three) times daily. 06/23/15   Mady Gemma, PA-C  loratadine (CLARITIN) 10 MG tablet Take 1 tablet (10 mg total) by mouth daily. 06/09/15   Janne Napoleon, NP  methocarbamol (ROBAXIN) 500 MG tablet Take 1 tablet (500 mg total) by mouth 2 (two) times daily. 06/23/15   Mady Gemma, PA-C  naproxen (NAPROSYN) 375 MG tablet Take 1 tablet (375 mg total) by mouth 2 (two) times daily. 06/09/15   Janne Napoleon, NP  oxyCODONE-acetaminophen (PERCOCET/ROXICET) 5-325 MG tablet Take 2 tablets by mouth every 4 (four) hours as needed for severe pain. 09/19/15   Fayrene Helper, PA-C    Family History No family history on file.  Social History Social History  Substance Use Topics  . Smoking status: Current Some Day Smoker    Packs/day: 0.50    Types: Cigarettes  . Smokeless tobacco: Never Used  . Alcohol use Yes     Comment: weekends only     Allergies   Patient has no known allergies.   Review of Systems Review of Systems  Constitutional: Negative for fever.  HENT: Positive for congestion and rhinorrhea. Negative for sore throat.   Respiratory: Positive  for cough. Negative for shortness of breath.   Cardiovascular: Negative for chest pain.  Gastrointestinal: Negative for abdominal pain, nausea and vomiting.  Neurological: Negative for numbness.     Physical Exam Updated Vital Signs BP 113/71 (BP Location: Left Arm)   Pulse 87   Temp 98.5 F (36.9 C) (Oral)   Resp 17   Ht 5\' 2"  (1.575 m)   Wt 56.7 kg (125 lb)   LMP 06/21/2017   SpO2 100%   BMI 22.86 kg/m   Physical Exam  Constitutional: She appears well-developed and well-nourished.  Sitting comfortably on examination  table  HENT:  Head: Normocephalic and atraumatic.  Nose: Mucosal edema present.  Eyes: Conjunctivae and EOM are normal. Right eye exhibits no discharge. Left eye exhibits no discharge. No scleral icterus.  Cardiovascular: Normal rate and regular rhythm.   Pulses:      Radial pulses are 2+ on the right side, and 2+ on the left side.  Pulmonary/Chest: Effort normal. No respiratory distress. She has rhonchi.  No evidence of respiratory distress. Able to speak in full sentences without difficulty. No tenderness to palpation of anterior chest wall. No deformities or crepitus noted. Mild rhonchi in the upper lung fields that are easily cleared with coughing.  Neurological: She is alert.  Skin: Skin is warm and dry.  Psychiatric: She has a normal mood and affect. Her speech is normal and behavior is normal.  Nursing note and vitals reviewed.    ED Treatments / Results  Labs (all labs ordered are listed, but only abnormal results are displayed) Labs Reviewed - No data to display  EKG  EKG Interpretation None       Radiology Dg Chest 2 View  Result Date: 07/07/2017 CLINICAL DATA:  Cough since yesterday. EXAM: CHEST  2 VIEW COMPARISON:  None. FINDINGS: The heart size and mediastinal contours are within normal limits. Both lungs are clear. The visualized skeletal structures are unremarkable. IMPRESSION: No active cardiopulmonary disease. Electronically Signed   By: Sherian Rein M.D.   On: 07/07/2017 20:38    Procedures Procedures (including critical care time)  Medications Ordered in ED Medications - No data to display   Initial Impression / Assessment and Plan / ED Course  I have reviewed the triage vital signs and the nursing notes.  Pertinent labs & imaging results that were available during my care of the patient were reviewed by me and considered in my medical decision making (see chart for details).     46 year old female who presents with 2 days of rhinorrhea cough and  intermittent congestion. Patient is afebrile, non-toxic appearing, sitting comfortably on examination table. Vital signs reviewed and stable. Patient is not hypoxic. She has no evidence of respiratory distress. On physical exam, she has some mild rhonchi in the upper lung fields are easily cleared with coughing. Consider URI versus pneumonia. History/physical exam or not concerning for ACS etiology or PE. Chest x-ray ordered at triage.  Chest x-ray reviewed. Negative for any acute infectious etiology. EKG shows sinus tach. Discussed results with patient. We'll plan to treat symptomatically. Encourage conservative at-home therapies with patient. Instructed patient to follow-up with PCP in 2 days. Strict return precautions discussed. Patient expresses understanding and agreement to plan.    Final Clinical Impressions(s) / ED Diagnoses   Final diagnoses:  Cough  Viral URI with cough    New Prescriptions Discharge Medication List as of 07/07/2017 10:25 PM    START taking these medications   Details  albuterol (  PROVENTIL HFA;VENTOLIN HFA) 108 (90 Base) MCG/ACT inhaler Inhale 2 puffs into the lungs every 4 (four) hours as needed for wheezing or shortness of breath., Starting Thu 07/07/2017, Print    benzonatate (TESSALON) 100 MG capsule Take 1 capsule (100 mg total) by mouth every 8 (eight) hours., Starting Thu 07/07/2017, Print         Maxwell CaulLayden, Lindsey A, PA-C 07/08/17 [id number]    Gerhard MunchLockwood, Robert, MD 07/11/17 [phone]

## 2017-07-07 NOTE — Discharge Instructions (Signed)
You can take Tylenol or Ibuprofen as directed for pain. You can alternate Tylenol and Ibuprofen every 4 hours. If you take Tylenol at 1pm, then you can take Ibuprofen at 5pm. Then you can take Tylenol again at 9pm.   Take Tessalon Perles as directed.  Use albuterol inhaler as directed.  Follow-up with her primary care doctor next 24-48 hours for further evaluation.  Return the emergency Department for any worsening chest pain, difficulty breathing, fever, difficulty swallowing or any other worsening or concerning symptoms.

## 2018-11-12 ENCOUNTER — Emergency Department (HOSPITAL_COMMUNITY)
Admission: EM | Admit: 2018-11-12 | Discharge: 2018-11-12 | Disposition: A | Discharge status: Home / Self Care | Payer: Medicaid Other | Attending: Emergency Medicine | Admitting: Emergency Medicine

## 2018-11-12 ENCOUNTER — Other Ambulatory Visit: Payer: Self-pay

## 2018-11-12 ENCOUNTER — Encounter (HOSPITAL_COMMUNITY): Payer: Self-pay | Admitting: Emergency Medicine

## 2018-11-12 DIAGNOSIS — F17210 Nicotine dependence, cigarettes, uncomplicated: Secondary | ICD-10-CM | POA: Insufficient documentation

## 2018-11-12 DIAGNOSIS — E876 Hypokalemia: Secondary | ICD-10-CM | POA: Insufficient documentation

## 2018-11-12 DIAGNOSIS — R42 Dizziness and giddiness: Secondary | ICD-10-CM

## 2018-11-12 DIAGNOSIS — E860 Dehydration: Secondary | ICD-10-CM

## 2018-11-12 LAB — BASIC METABOLIC PANEL
Anion gap: 11 (ref 5–15)
BUN: 19 mg/dL (ref 6–20)
CALCIUM: 8.9 mg/dL (ref 8.9–10.3)
CO2: 19 mmol/L — AB (ref 22–32)
Chloride: 108 mmol/L (ref 98–111)
Creatinine, Ser: 1.01 mg/dL — ABNORMAL HIGH (ref 0.44–1.00)
GFR calc Af Amer: >60 mL/min (ref >60)
GFR calc non Af Amer: >60 mL/min (ref >60)
GLUCOSE: 98 mg/dL (ref 70–99)
Potassium: 3.3 mmol/L — ABNORMAL LOW (ref 3.5–5.1)
Sodium: 138 mmol/L (ref 135–145)

## 2018-11-12 LAB — CBC
HCT: 37.5 % (ref 36.0–46.0)
Hemoglobin: 11.5 g/dL — ABNORMAL LOW (ref 12.0–15.0)
MCH: 24.1 pg — AB (ref 26.0–34.0)
MCHC: 30.7 g/dL (ref 30.0–36.0)
MCV: 78.5 fL — AB (ref 80.0–100.0)
PLATELETS: 395 10*3/uL (ref 150–400)
RBC: 4.78 MIL/uL (ref 3.87–5.11)
RDW: 17.9 % — ABNORMAL HIGH (ref 11.5–15.5)
WBC: 7.1 10*3/uL (ref 4.0–10.5)
nRBC: 0 % (ref 0.0–0.2)

## 2018-11-12 LAB — I-STAT BETA HCG BLOOD, ED (MC, WL, AP ONLY): I-stat hCG, quantitative: <5 m[IU]/mL (ref <5)

## 2018-11-12 MED ORDER — POTASSIUM CHLORIDE CRYS ER 20 MEQ PO TBCR
40.0000 meq | EXTENDED_RELEASE_TABLET | Freq: Once | ORAL | Status: AC
  Administered 2018-11-12: 40 meq via ORAL
  Filled 2018-11-12: qty 2

## 2018-11-12 MED ORDER — SODIUM CHLORIDE 0.9 % IV BOLUS
1000.0000 mL | Freq: Once | INTRAVENOUS | Status: AC
  Administered 2018-11-12: 1000 mL via INTRAVENOUS

## 2018-11-12 NOTE — ED Triage Notes (Signed)
Pt thinks iron is low. reports that she felt dizzy, tired since last night and has headache.

## 2018-11-12 NOTE — ED Notes (Signed)
Patient ambulated to restroom without complication. 

## 2018-11-12 NOTE — Discharge Instructions (Addendum)
Continue your home medications as previously prescribed. Return to the ED for worsening symptoms, head injuries or falls, numbness in arms or legs, blurry vision, increased bleeding or loss of consciousness.

## 2018-11-12 NOTE — ED Notes (Signed)
Bed: WA05 Expected date:  Expected time:  Means of arrival:  Comments: HOLD 

## 2018-11-12 NOTE — ED Provider Notes (Signed)
[facility]-EMERGENCY DEPT Provider Note   CSN: [id number] Arrival date & time: 11/12/18  1532    History   Chief Complaint Chief Complaint  Patient presents with  . Dizziness  . Headache  . Fatigue    HPI Hannah May is a 48 y.o. female who presents to ED for a chief complaint of dizziness, fatigue and headache.  States that yesterday she was at home when she had a gradual onset of headache, feeling weak all over and dizziness.  She states that this is happened her past when her iron is low.  She does not take iron supplementation.  She is currently on her menstrual cycle.  She states that "I feel like I was going to pass out but I did not."  She denies any head injury or loss of consciousness.  Headache improved with ibuprofen.  Denies any vomiting, numbness in arms or legs, changes to gait, recent medication changes, shortness of breath.     HPI  History reviewed. No pertinent past medical history.  There are no active problems to display for this patient.   Past Surgical History:  Procedure Laterality Date  . TUBAL LIGATION       OB History   No obstetric history on file.      Home Medications    Prior to Admission medications   Medication Sig Start Date End Date Taking? Authorizing Provider  acyclovir (ZOVIRAX) 400 MG tablet Take 1 tablet (400 mg total) by mouth 4 (four) times daily. Patient not taking: Reported on 11/12/2018 06/09/15   Janne Napoleon, NP  albuterol (PROVENTIL HFA;VENTOLIN HFA) 108 (90 Base) MCG/ACT inhaler Inhale 2 puffs into the lungs every 4 (four) hours as needed for wheezing or shortness of breath. Patient not taking: Reported on 11/12/2018 07/07/17   Graciella Freer A, PA-C  benzonatate (TESSALON) 100 MG capsule Take 1 capsule (100 mg total) by mouth every 8 (eight) hours. Patient not taking: Reported on 11/12/2018 07/07/17   Graciella Freer A, PA-C  cephALEXin (KEFLEX) 500 MG capsule Take 1 capsule (500 mg total) by mouth 4  (four) times daily. Patient not taking: Reported on 11/12/2018 06/13/17   Graciella Freer A, PA-C  ibuprofen (ADVIL,MOTRIN) 800 MG tablet Take 1 tablet (800 mg total) by mouth 3 (three) times daily. Patient not taking: Reported on 11/12/2018 06/23/15   Mady Gemma, PA-C  loratadine (CLARITIN) 10 MG tablet Take 1 tablet (10 mg total) by mouth daily. Patient not taking: Reported on 11/12/2018 06/09/15   Janne Napoleon, NP  methocarbamol (ROBAXIN) 500 MG tablet Take 1 tablet (500 mg total) by mouth 2 (two) times daily. Patient not taking: Reported on 11/12/2018 06/23/15   Mady Gemma, PA-C  naproxen (NAPROSYN) 375 MG tablet Take 1 tablet (375 mg total) by mouth 2 (two) times daily. Patient not taking: Reported on 11/12/2018 06/09/15   Janne Napoleon, NP  oxyCODONE-acetaminophen (PERCOCET/ROXICET) 5-325 MG tablet Take 2 tablets by mouth every 4 (four) hours as needed for severe pain. Patient not taking: Reported on 11/12/2018 09/19/15   Fayrene Helper, PA-C    Family History No family history on file.  Social History Social History   Tobacco Use  . Smoking status: Current Some Day Smoker    Packs/day: 0.50    Types: Cigarettes  . Smokeless tobacco: Never Used  Substance Use Topics  . Alcohol use: Yes    Comment: weekends only  . Drug use: Yes    Types: Marijuana  Comment: 3 x  a day     Allergies   Patient has no known allergies.   Review of Systems Review of Systems  Constitutional: Positive for fatigue. Negative for appetite change, chills and fever.  HENT: Negative for ear pain, rhinorrhea, sneezing and sore throat.   Eyes: Negative for photophobia and visual disturbance.  Respiratory: Negative for cough, chest tightness, shortness of breath and wheezing.   Cardiovascular: Negative for chest pain and palpitations.  Gastrointestinal: Negative for abdominal pain, blood in stool, constipation, diarrhea, nausea and vomiting.  Genitourinary: Negative for dysuria, hematuria and  urgency.  Musculoskeletal: Negative for myalgias.  Skin: Negative for rash.  Neurological: Positive for dizziness, weakness and headaches. Negative for light-headedness.     Physical Exam Updated Vital Signs BP 119/69   Pulse 61   Temp 98 F (36.7 C) (Oral)   Resp 18   Ht 5\' 2"  (1.575 m)   Wt 51.3 kg   LMP 11/08/2018   SpO2 100%   BMI 20.67 kg/m   Physical Exam Vitals signs and nursing note reviewed.  Constitutional:      General: She is not in acute distress.    Appearance: She is well-developed.  HENT:     Head: Normocephalic and atraumatic.     Nose: Nose normal.  Eyes:     General: No scleral icterus.       Right eye: No discharge.        Left eye: No discharge.     Conjunctiva/sclera: Conjunctivae normal.     Pupils: Pupils are equal, round, and reactive to light.  Neck:     Musculoskeletal: Normal range of motion and neck supple.  Cardiovascular:     Rate and Rhythm: Normal rate and regular rhythm.     Heart sounds: Normal heart sounds. No murmur. No friction rub. No gallop.   Pulmonary:     Effort: Pulmonary effort is normal. No respiratory distress.     Breath sounds: Normal breath sounds.  Abdominal:     General: Bowel sounds are normal. There is no distension.     Palpations: Abdomen is soft.     Tenderness: There is no abdominal tenderness. There is no guarding.  Musculoskeletal: Normal range of motion.  Skin:    General: Skin is warm and dry.     Findings: No rash.  Neurological:     General: No focal deficit present.     Mental Status: She is alert and oriented to person, place, and time.     Cranial Nerves: No cranial nerve deficit.     Sensory: No sensory deficit.     Motor: No weakness or abnormal muscle tone.     Coordination: Coordination normal.     Comments: Pupils reactive. No facial asymmetry noted. Cranial nerves appear grossly intact. Sensation intact to light touch on face, BUE and BLE. Strength 5/5 in BUE and BLE.       ED  Treatments / Results  Labs (all labs ordered are listed, but only abnormal results are displayed) Labs Reviewed  BASIC METABOLIC PANEL - Abnormal; Notable for the following components:      Result Value   Potassium 3.3 (*)    CO2 19 (*)    Creatinine, Ser 1.01 (*)    All other components within normal limits  CBC - Abnormal; Notable for the following components:   Hemoglobin 11.5 (*)    MCV 78.5 (*)    MCH 24.1 (*)    RDW 17.9 (*)  All other components within normal limits  I-STAT BETA HCG BLOOD, ED ([facility], [facility], [facility] ONLY)    EKG None  Radiology No results found.  Procedures Procedures (including critical care time)  Medications Ordered in ED Medications  sodium chloride 0.9 % bolus 1,000 mL (1,000 mLs Intravenous New Bag/Given 11/12/18 1710)  potassium chloride SA (K-DUR,KLOR-CON) CR tablet 40 mEq (40 mEq Oral Given 11/12/18 1738)     Initial Impression / Assessment and Plan / ED Course  I have reviewed the triage vital signs and the nursing notes.  Pertinent labs & imaging results that were available during my care of the patient were reviewed by me and considered in my medical decision making (see chart for details).        48 year old female presents to ED for onset of headache, generalized weakness, fatigue and dizziness since yesterday.  Similar symptoms in the past when she had low iron.  She does not take iron supplementation.  She reports normal vaginal bleeding at the moment.  On my exam she is overall well-appearing.  No deficits or neurological exam noted.  Her headache improved yesterday with ibuprofen.  She is requesting IV fluids.  CBC shows hemoglobin of 11.5.  BMP unremarkable with the exception of mild hypokalemia at 3.3 which shows repleted orally.  hCG is negative.  Her vital signs are within normal limits.  We will continue to monitor after IV fluids.  Patient reports improvement in her symptoms with fluids and p.o. potassium. Suspect that symptoms are due  to vertigo. I doubt neurological or cardiac cause of symptoms as she is denying chest pain, shortness of breath and is low risk.  We will have her follow-up with PCP and return to ED for any severe worsening symptoms.  Patient is hemodynamically stable, in NAD, and able to ambulate in the ED. Evaluation does not show pathology that would require ongoing emergent intervention or inpatient treatment. I explained the diagnosis to the patient. Pain has been managed and has no complaints prior to discharge. Patient is comfortable with above plan and is stable for discharge at this time. All questions were answered prior to disposition. Strict return precautions for returning to the ED were discussed. Encouraged follow up with PCP.    Portions of this note were generated with Scientist, clinical (histocompatibility and immunogenetics). Dictation errors may occur despite best attempts at proofreading.  Final Clinical Impressions(s) / ED Diagnoses   Final diagnoses:  Hypokalemia  Dehydration  Vertigo    ED Discharge Orders    None       Dietrich Pates, PA-C 11/12/18 1923    Lorre Nick, MD 11/13/18 2309

## 2019-05-08 ENCOUNTER — Other Ambulatory Visit: Payer: Self-pay

## 2019-05-08 DIAGNOSIS — Z20822 Contact with and (suspected) exposure to covid-19: Secondary | ICD-10-CM

## 2019-05-09 LAB — NOVEL CORONAVIRUS, NAA: SARS-CoV-2, NAA: NOT DETECTED

## 2020-04-08 ENCOUNTER — Emergency Department (HOSPITAL_COMMUNITY)
Admission: EM | Admit: 2020-04-08 | Discharge: 2020-04-09 | Disposition: A | Discharge status: Home / Self Care | Payer: HRSA Program | Attending: Emergency Medicine | Admitting: Emergency Medicine

## 2020-04-08 ENCOUNTER — Other Ambulatory Visit: Payer: Self-pay

## 2020-04-08 ENCOUNTER — Encounter (HOSPITAL_COMMUNITY): Payer: Self-pay | Admitting: *Deleted

## 2020-04-08 DIAGNOSIS — Z5321 Procedure and treatment not carried out due to patient leaving prior to being seen by health care provider: Secondary | ICD-10-CM | POA: Diagnosis not present

## 2020-04-08 DIAGNOSIS — U071 COVID-19: Secondary | ICD-10-CM | POA: Diagnosis not present
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
## 2020-04-09 NOTE — ED Notes (Signed)
Pt called for vitals 3 times and no answer

## 2020-05-05 ENCOUNTER — Encounter: Payer: Self-pay | Admitting: Emergency Medicine

## 2020-05-05 ENCOUNTER — Ambulatory Visit
Admission: EM | Admit: 2020-05-05 | Discharge: 2020-05-05 | Disposition: A | Discharge status: Home / Self Care | Payer: Self-pay | Attending: Emergency Medicine | Admitting: Emergency Medicine

## 2020-05-05 ENCOUNTER — Other Ambulatory Visit: Payer: Self-pay

## 2020-05-05 DIAGNOSIS — M79602 Pain in left arm: Secondary | ICD-10-CM

## 2020-05-05 MED ORDER — IBUPROFEN 800 MG PO TABS: 800.0000 mg | ORAL_TABLET | Freq: Three times a day (TID) | ORAL | 0 refills | Status: AC

## 2020-05-05 NOTE — Discharge Instructions (Signed)
RICE: rest, ice, compression, elevation as needed for pain.   Cold therapy (ice packs) can be used to help swelling both after injury and after prolonged use of areas of chronic pain/aches.  Pain medication:  350 mg-1000 mg of Tylenol (acetaminophen) and/or 200 mg - 800 mg of Advil (ibuprofen, Motrin) every 8 hours as needed.  May alternate between the two throughout the day as they are generally safe to take together.  DO NOT exceed more than 3000 mg of Tylenol or 3200 mg of ibuprofen in a 24 hour period as this could damage your stomach, kidneys, liver, or increase your bleeding risk.   Important to follow up with specialist(s) below for further evaluation/management if your symptoms persist or worsen. 
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
## 2020-06-06 ENCOUNTER — Ambulatory Visit
Admission: RE | Admit: 2020-06-06 | Discharge: 2020-06-06 | Disposition: A | Discharge status: Home / Self Care | Payer: No Typology Code available for payment source | Source: Ambulatory Visit | Attending: Obstetrics and Gynecology | Admitting: Obstetrics and Gynecology

## 2020-06-06 ENCOUNTER — Other Ambulatory Visit: Payer: Self-pay | Admitting: Obstetrics and Gynecology

## 2020-06-06 DIAGNOSIS — R7611 Nonspecific reaction to tuberculin skin test without active tuberculosis: Secondary | ICD-10-CM

## 2020-09-10 ENCOUNTER — Other Ambulatory Visit: Payer: Self-pay

## 2020-09-10 ENCOUNTER — Ambulatory Visit
Admission: EM | Admit: 2020-09-10 | Discharge: 2020-09-10 | Disposition: A | Discharge status: Home / Self Care | Payer: No Typology Code available for payment source

## 2020-09-10 NOTE — ED Notes (Signed)
Pt was called & informed me she was at the store, I informed her to re-reg if she wanted to be seen.

## 2020-09-13 ENCOUNTER — Other Ambulatory Visit: Payer: Self-pay

## 2020-09-13 ENCOUNTER — Emergency Department (HOSPITAL_COMMUNITY)
Admission: EM | Admit: 2020-09-13 | Discharge: 2020-09-14 | Disposition: A | Discharge status: Home / Self Care | Payer: HRSA Program | Attending: Emergency Medicine | Admitting: Emergency Medicine

## 2020-09-13 DIAGNOSIS — F17210 Nicotine dependence, cigarettes, uncomplicated: Secondary | ICD-10-CM | POA: Insufficient documentation

## 2020-09-13 DIAGNOSIS — R112 Nausea with vomiting, unspecified: Secondary | ICD-10-CM | POA: Diagnosis not present

## 2020-09-13 DIAGNOSIS — U071 COVID-19: Secondary | ICD-10-CM | POA: Insufficient documentation

## 2020-09-13 DIAGNOSIS — R197 Diarrhea, unspecified: Secondary | ICD-10-CM | POA: Diagnosis present

## 2020-09-13 DIAGNOSIS — K529 Noninfective gastroenteritis and colitis, unspecified: Secondary | ICD-10-CM | POA: Insufficient documentation

## 2020-09-13 LAB — URINALYSIS, ROUTINE W REFLEX MICROSCOPIC
Bilirubin Urine: NEGATIVE
Glucose, UA: NEGATIVE mg/dL
Ketones, ur: 20 mg/dL — AB
Leukocytes,Ua: NEGATIVE
Nitrite: POSITIVE — AB
Protein, ur: 30 mg/dL — AB
Specific Gravity, Urine: 1.02 (ref 1.005–1.030)
pH: 6 (ref 5.0–8.0)

## 2020-09-13 LAB — COMPREHENSIVE METABOLIC PANEL
ALT: 24 U/L (ref 0–44)
AST: 40 U/L (ref 15–41)
Albumin: 3.6 g/dL (ref 3.5–5.0)
Alkaline Phosphatase: 53 U/L (ref 38–126)
Anion gap: 9 (ref 5–15)
BUN: 10 mg/dL (ref 6–20)
CO2: 21 mmol/L — ABNORMAL LOW (ref 22–32)
Calcium: 8.7 mg/dL — ABNORMAL LOW (ref 8.9–10.3)
Chloride: 104 mmol/L (ref 98–111)
Creatinine, Ser: 0.8 mg/dL (ref 0.44–1.00)
GFR, Estimated: >60 mL/min (ref >60)
Glucose, Bld: 86 mg/dL (ref 70–99)
Potassium: 3.7 mmol/L (ref 3.5–5.1)
Sodium: 134 mmol/L — ABNORMAL LOW (ref 135–145)
Total Bilirubin: 0.5 mg/dL (ref 0.3–1.2)
Total Protein: 7.6 g/dL (ref 6.5–8.1)

## 2020-09-13 LAB — CBC
HCT: 34.4 % — ABNORMAL LOW (ref 36.0–46.0)
Hemoglobin: 9.8 g/dL — ABNORMAL LOW (ref 12.0–15.0)
MCH: 20 pg — ABNORMAL LOW (ref 26.0–34.0)
MCHC: 28.5 g/dL — ABNORMAL LOW (ref 30.0–36.0)
MCV: 70.2 fL — ABNORMAL LOW (ref 80.0–100.0)
Platelets: 176 10*3/uL (ref 150–400)
RBC: 4.9 MIL/uL (ref 3.87–5.11)
RDW: 20.7 % — ABNORMAL HIGH (ref 11.5–15.5)
WBC: 4.2 10*3/uL (ref 4.0–10.5)
nRBC: 0 % (ref 0.0–0.2)

## 2020-09-13 LAB — I-STAT BETA HCG BLOOD, ED (MC, WL, AP ONLY): I-stat hCG, quantitative: <5 m[IU]/mL (ref <5)

## 2020-09-13 LAB — LIPASE, BLOOD: Lipase: 21 U/L (ref 11–51)

## 2020-09-13 NOTE — ED Triage Notes (Signed)
Pt presents to ED POv. Pt c/o emesis x3. Pt denies any other GI s/s. Pt states that she was feeling fine until 1400 and then began to throw up. Denies pain

## 2020-09-14 ENCOUNTER — Emergency Department (HOSPITAL_COMMUNITY): Payer: HRSA Program

## 2020-09-14 LAB — RESP PANEL BY RT-PCR (FLU A&B, COVID) ARPGX2
Influenza A by PCR: NEGATIVE
Influenza B by PCR: NEGATIVE
SARS Coronavirus 2 by RT PCR: POSITIVE — AB

## 2020-09-14 LAB — POC SARS CORONAVIRUS 2 AG -  ED: SARS Coronavirus 2 Ag: NEGATIVE

## 2020-09-14 MED ORDER — IOHEXOL 300 MG/ML  SOLN
80.0000 mL | Freq: Once | INTRAMUSCULAR | Status: AC | PRN
  Administered 2020-09-14: 80 mL via INTRAVENOUS

## 2020-09-14 MED ORDER — ONDANSETRON HCL 4 MG/2ML IJ SOLN
4.0000 mg | Freq: Once | INTRAMUSCULAR | Status: AC
  Administered 2020-09-14: 4 mg via INTRAVENOUS
  Filled 2020-09-14: qty 2

## 2020-09-14 MED ORDER — ONDANSETRON HCL 4 MG PO TABS: 4.0000 mg | ORAL_TABLET | Freq: Four times a day (QID) | ORAL | 0 refills | Status: DC

## 2020-09-14 MED ORDER — ACETAMINOPHEN 325 MG PO TABS
650.0000 mg | ORAL_TABLET | Freq: Once | ORAL | Status: AC
  Administered 2020-09-14: 650 mg via ORAL
  Filled 2020-09-14: qty 2

## 2020-09-14 MED ORDER — SODIUM CHLORIDE 0.9 % IV BOLUS
1000.0000 mL | Freq: Once | INTRAVENOUS | Status: AC
  Administered 2020-09-14: 1000 mL via INTRAVENOUS

## 2020-09-14 MED ORDER — BENZONATATE 100 MG PO CAPS: 100.0000 mg | ORAL_CAPSULE | Freq: Three times a day (TID) | ORAL | 0 refills | Status: AC

## 2020-09-14 NOTE — ED Notes (Signed)
Pt's boyfriend asked that he be called when patient is getting discharged so that he can come pick her up, [place], [phone].

## 2020-09-14 NOTE — ED Provider Notes (Signed)
[name] Provider Note   CSN: [id number] Arrival date & time: 09/13/20  1656     History Chief Complaint  Patient presents with  . Emesis    Hannah May is a 50 y.o. female.  HPI   50 y/o F presenting to the ED today for eval of NV.  States she has been having nausea and vomiting for the last 3 to 4 days.  She denies any associated abdominal pain.  She has also had some diarrhea.  She has had some sweats, chills and feels like she has a fever.  She denies any dysuria, frequency, urgency.  She denies any associated cough or known Covid contacts. Smokes marijuana daily  No past medical history on file.  There are no problems to display for this patient.   Past Surgical History:  Procedure Laterality Date  . TUBAL LIGATION       OB History   No obstetric history on file.     No family history on file.  Social History   Tobacco Use  . Smoking status: Current Some Day Smoker    Packs/day: 0.50    Types: Cigarettes  . Smokeless tobacco: Never Used  Vaping Use  . Vaping Use: Never used  Substance Use Topics  . Alcohol use: Yes    Comment: weekends only  . Drug use: Yes    Types: Marijuana    Comment: 3 x  a day    Home Medications Prior to Admission medications   Medication Sig Start Date End Date Taking? Authorizing Provider  benzonatate (TESSALON) 100 MG capsule Take 1 capsule (100 mg total) by mouth every 8 (eight) hours for 5 days. 09/14/20 09/19/20 Yes Melquan Ernsberger S, PA-C  ondansetron (ZOFRAN) 4 MG tablet Take 1 tablet (4 mg total) by mouth every 6 (six) hours. 09/14/20  Yes Zayleigh Stroh S, PA-C  albuterol (PROVENTIL HFA;VENTOLIN HFA) 108 (90 Base) MCG/ACT inhaler Inhale 2 puffs into the lungs every 4 (four) hours as needed for wheezing or shortness of breath. Patient not taking: Reported on 11/12/2018 07/07/17 05/05/20  Providence Lanius A, PA-C  loratadine (CLARITIN) 10 MG tablet Take 1 tablet (10 mg total) by mouth  daily. Patient not taking: Reported on 11/12/2018 06/09/15 05/05/20  Ashley Murrain, NP    Allergies    Patient has no known allergies.  Review of Systems   Review of Systems  Constitutional: Positive for chills and diaphoresis. Negative for fever.  HENT: Negative for ear pain and sore throat.   Eyes: Negative for pain and visual disturbance.  Respiratory: Negative for cough and shortness of breath.   Cardiovascular: Negative for chest pain.  Gastrointestinal: Positive for diarrhea, nausea and vomiting. Negative for abdominal pain.  Genitourinary: Negative for dysuria and hematuria.  Musculoskeletal: Negative for back pain.  Skin: Negative for color change and rash.  Neurological: Negative for seizures and syncope.  All other systems reviewed and are negative.   Physical Exam Updated Vital Signs BP 101/63   Pulse 77   Temp 99.6 F (37.6 C) (Oral)   Resp 16   SpO2 100%   Physical Exam Vitals and nursing note reviewed.  Constitutional:      General: She is not in acute distress.    Appearance: She is well-developed and well-nourished.  HENT:     Head: Normocephalic and atraumatic.  Eyes:     Conjunctiva/sclera: Conjunctivae normal.  Cardiovascular:     Rate and Rhythm: Normal rate and  regular rhythm.     Heart sounds: Normal heart sounds. No murmur heard.   Pulmonary:     Effort: Pulmonary effort is normal. No respiratory distress.     Breath sounds: Normal breath sounds. No wheezing, rhonchi or rales.  Abdominal:     General: Bowel sounds are normal.     Palpations: Abdomen is soft.     Tenderness: There is no abdominal tenderness. There is no guarding or rebound.  Musculoskeletal:        General: No edema.     Cervical back: Neck supple.  Skin:    General: Skin is warm and dry.  Neurological:     Mental Status: She is alert.  Psychiatric:        Mood and Affect: Mood and affect normal.     ED Results / Procedures / Treatments   Labs (all labs ordered are  listed, but only abnormal results are displayed) Labs Reviewed  RESP PANEL BY RT-PCR (FLU A&B, COVID) ARPGX2 - Abnormal; Notable for the following components:      Result Value   SARS Coronavirus 2 by RT PCR POSITIVE (*)    All other components within normal limits  COMPREHENSIVE METABOLIC PANEL - Abnormal; Notable for the following components:   Sodium 134 (*)    CO2 21 (*)    Calcium 8.7 (*)    All other components within normal limits  CBC - Abnormal; Notable for the following components:   Hemoglobin 9.8 (*)    HCT 34.4 (*)    MCV 70.2 (*)    MCH 20.0 (*)    MCHC 28.5 (*)    RDW 20.7 (*)    All other components within normal limits  URINALYSIS, ROUTINE W REFLEX MICROSCOPIC - Abnormal; Notable for the following components:   APPearance CLOUDY (*)    Hgb urine dipstick MODERATE (*)    Ketones, ur 20 (*)    Protein, ur 30 (*)    Nitrite POSITIVE (*)    Bacteria, UA MANY (*)    All other components within normal limits  LIPASE, BLOOD  I-STAT BETA HCG BLOOD, ED ([facility], [facility], [facility] ONLY)  POC SARS CORONAVIRUS 2 AG -  ED    EKG None  Radiology CT ABDOMEN PELVIS W CONTRAST  Result Date: 09/14/2020 CLINICAL DATA:  Abdominal pain, nausea, vomiting and fever. EXAM: CT ABDOMEN AND PELVIS WITH CONTRAST TECHNIQUE: Multidetector CT imaging of the abdomen and pelvis was performed using the standard protocol following bolus administration of intravenous contrast. CONTRAST:  88mL OMNIPAQUE IOHEXOL 300 MG/ML  SOLN COMPARISON:  None. FINDINGS: Lower chest: No significant pulmonary nodules or acute consolidative airspace disease. Hepatobiliary: Normal liver size. No liver mass. Normal gallbladder with no radiopaque cholelithiasis. No biliary ductal dilatation. Pancreas: Normal, with no mass or duct dilation. Spleen: Normal size. No mass. Adrenals/Urinary Tract: Normal adrenals. Simple 2.4 cm medial lower right renal cyst. Additional subcentimeter hypodense renal cortical lesions scattered in both  kidneys, too small to characterize, requiring no follow-up. No hydronephrosis. Normal bladder. Stomach/Bowel: Normal non-distended stomach. Normal caliber small bowel with no small bowel wall thickening. Appendix not discretely visualized. No pericecal inflammatory changes. Scattered fluid levels in the large bowel with no large bowel wall thickening, diverticulosis or significant pericolonic fat stranding. Vascular/Lymphatic: Atherosclerotic nonaneurysmal abdominal aorta. Patent portal, splenic, hepatic and renal veins. No pathologically enlarged lymph nodes in the abdomen or pelvis. Reproductive: Grossly normal uterus. Bilateral tubal ligation clips are in place. 2 subjacent right adnexal cysts, largest  2.1 cm (series 3/image 47). No left adnexal masses. Other: No pneumoperitoneum. No focal fluid collection. Small volume pelvic cul-de-sac free fluid. Musculoskeletal: No aggressive appearing focal osseous lesions. IMPRESSION: 1. Scattered fluid levels in the large bowel, indicative of a nonspecific malabsorptive state, as can be seen with mild colitis. No evidence of bowel obstruction or bowel wall thickening. 2. Two subjacent right adnexal cysts, largest 2.1 cm, with small volume pelvic cul-de-sac free fluid, probably physiologic. No follow-up imaging recommended unless as otherwise clinically warranted. Note: This recommendation does not apply to premenarchal patients and to those with increased risk (genetic, family history, elevated tumor markers or other high-risk factors) of ovarian cancer. Reference: JACR 2020 Feb; 17(2):248-254 3. Aortic Atherosclerosis (ICD10-I70.0). Electronically Signed   By: Delbert Phenix M.D.   On: 09/14/2020 05:15    Procedures Procedures (including critical care time)  Medications Ordered in ED Medications  sodium chloride 0.9 % bolus 1,000 mL (0 mLs Intravenous Stopped 09/14/20 0455)  ondansetron (ZOFRAN) injection 4 mg (4 mg Intravenous Given 09/14/20 0401)  acetaminophen  (TYLENOL) tablet 650 mg (650 mg Oral Given 09/14/20 0400)  iohexol (OMNIPAQUE) 300 MG/ML solution 80 mL (80 mLs Intravenous Contrast Given 09/14/20 0447)    ED Course  I have reviewed the triage vital signs and the nursing notes.  Pertinent labs & imaging results that were available during my care of the patient were reviewed by me and considered in my medical decision making (see chart for details).    MDM Rules/Calculators/A&P                          50 year old female presenting the emergency department today for evaluation of nausea vomiting and diarrhea for the last several days.  Denies upper respiratory symptoms or known Covid exposures.  Reviewed/interpreted labs CBC, CMP and lipase are grossly unremarkable.   Covid test was positive  Urinalysis shows hematuria, ketonuria, proteinuria, nitrites but no leukocytes.  Also shows 11-20 RBCs, 0-5 WBCs, many bacteria and 11-20 squamous epithelial cells.. Pt w/o urinary sxs there for doubt infection.  Ct abd/pelvis shows colitis, this is likely viral and probably secondary to covid.  She passed p.o. challenge here in the ED.  We will give symptomatic treatment for home.  Advise close follow-up and strict return precautions.  She voices understanding of the plan and reasons to return.  All questions answered.  Patient stable for discharge.  Hannah May was evaluated in Emergency Department on 09/14/2020 for the symptoms described in the history of present illness. She was evaluated in the context of the global COVID-19 pandemic, which necessitated consideration that the patient might be at risk for infection with the SARS-CoV-2 virus that causes COVID-19. Institutional protocols and algorithms that pertain to the evaluation of patients at risk for COVID-19 are in a state of rapid change based on information released by regulatory bodies including the CDC and federal and state organizations. These policies and algorithms were followed during the  patient's care in the ED.   Final Clinical Impression(s) / ED Diagnoses Final diagnoses:  COVID  Nausea vomiting and diarrhea    Rx / DC Orders ED Discharge Orders         Ordered    benzonatate (TESSALON) 100 MG capsule  Every 8 hours        09/14/20 0605    ondansetron (ZOFRAN) 4 MG tablet  Every 6 hours        09/14/20 0605  Karrie Meres, PA-C 09/14/20 5784    Geoffery Lyons, MD 09/14/20 [phone]

## 2020-09-14 NOTE — Discharge Instructions (Addendum)
Take cough medication as directed.    You were given a prescription for zofran to help with your nausea. Please take as directed.  Stay well hydrated.   Rotate tylenol and motrin for fevers.   You should be isolated for at least 7 days since the onset of your symptoms AND >72 hours after symptoms resolution (absence of fever without the use of fever reducing medicaiton and improvement in respiratory symptoms), whichever is longer  Please follow up with your primary care provider within 5-7 days for re-evaluation of your symptoms. If you do not have a primary care provider, information for a healthcare clinic has been provided for you to make arrangements for follow up care. Please return to the emergency department for any new or worsening symptoms.  

## 2020-09-14 NOTE — ED Notes (Signed)
Pt able to eat and drink without N/V. State that she feels much better and is able to ambulate independently

## 2021-01-15 ENCOUNTER — Ambulatory Visit
Admission: EM | Admit: 2021-01-15 | Discharge: 2021-01-15 | Disposition: A | Discharge status: Home / Self Care | Payer: No Typology Code available for payment source | Attending: Family Medicine | Admitting: Family Medicine

## 2021-01-15 ENCOUNTER — Other Ambulatory Visit: Payer: Self-pay

## 2021-01-15 ENCOUNTER — Encounter: Payer: Self-pay | Admitting: Emergency Medicine

## 2021-01-15 DIAGNOSIS — L02811 Cutaneous abscess of head [any part, except face]: Secondary | ICD-10-CM

## 2021-01-15 DIAGNOSIS — K047 Periapical abscess without sinus: Secondary | ICD-10-CM

## 2021-01-15 MED ORDER — AMOXICILLIN 875 MG PO TABS: 875.0000 mg | ORAL_TABLET | Freq: Two times a day (BID) | ORAL | 0 refills | Status: DC

## 2021-01-15 NOTE — ED Triage Notes (Addendum)
Pt is present today with a cyst on the left side on her head. Pt states that she popped the cyst at home and she saw some drainage but it is causing some pain. Pt noticed the cyst five days ago. Pt states that she also has an abscess on the lift side on the inside her of mouth that she noticed a week ago.

## 2021-01-15 NOTE — ED Provider Notes (Signed)
[facility]    CSN: [id number] Arrival date & time: 01/15/21  1016      History   Chief Complaint Chief Complaint  Patient presents with  . Abscess    HPI Cordia L Shippy is a 50 y.o. female.   HPI    History reviewed. No pertinent past medical history.  There are no problems to display for this patient.   Past Surgical History:  Procedure Laterality Date  . TUBAL LIGATION      OB History   No obstetric history on file.      Home Medications    Prior to Admission medications   Medication Sig Start Date End Date Taking? Authorizing Provider  ondansetron (ZOFRAN) 4 MG tablet Take 1 tablet (4 mg total) by mouth every 6 (six) hours. 09/14/20   Couture, Cortni S, PA-C  albuterol (PROVENTIL HFA;VENTOLIN HFA) 108 (90 Base) MCG/ACT inhaler Inhale 2 puffs into the lungs every 4 (four) hours as needed for wheezing or shortness of breath. Patient not taking: Reported on 11/12/2018 07/07/17 05/05/20  Graciella Freer A, PA-C  loratadine (CLARITIN) 10 MG tablet Take 1 tablet (10 mg total) by mouth daily. Patient not taking: Reported on 11/12/2018 06/09/15 05/05/20  Janne Napoleon, NP    Family History History reviewed. No pertinent family history.  Social History Social History   Tobacco Use  . Smoking status: Current Some Day Smoker    Packs/day: 0.50    Types: Cigarettes  . Smokeless tobacco: Never Used  Vaping Use  . Vaping Use: Never used  Substance Use Topics  . Alcohol use: Yes    Comment: weekends only  . Drug use: Yes    Types: Marijuana    Comment: 3 x  a day     Allergies   Patient has no known allergies.   Review of Systems Review of Systems   Physical Exam Triage Vital Signs ED Triage Vitals  Enc Vitals Group     BP 01/15/21 1136 133/75     Pulse Rate 01/15/21 1136 83     Resp 01/15/21 1136 18     Temp 01/15/21 1136 97.8 F (36.6 C)     Temp Source 01/15/21 1136 Oral     SpO2 01/15/21 1136 97 %     Weight --      Height --       Head Circumference --      Peak Flow --      Pain Score 01/15/21 1135 10     Pain Loc --      Pain Edu? --      Excl. in GC? --    No data found.  Updated Vital Signs BP 133/75   Pulse 83   Temp 97.8 F (36.6 C) (Oral)   Resp 18   LMP 12/31/2020   SpO2 97%   Visual Acuity Right Eye Distance:   Left Eye Distance:   Bilateral Distance:    Right Eye Near:   Left Eye Near:    Bilateral Near:     Physical Exam General appearance: alert, well developed, well nourished, cooperative  Head: Normocephalic, without obvious abnormality, atraumatic, no facial swelling Mouth: Poor dentition, left gingival abscess upper region mouth Respiratory: Respirations even and unlabored, normal respiratory rate Heart: Rate and rhythm normal. No gallop or murmurs noted on exam  Extremities: No gross deformities Skin: Skin color, texture, turgor normal. No rashes seen  Psych: Appropriate mood and affect. Neurologic: GCS 15, normal coordination,  normal gait UC Treatments / Results  Labs (all labs ordered are listed, but only abnormal results are displayed) Labs Reviewed - No data to display  EKG   Radiology No results found.  Procedures Incision and Drainage  Date/Time: 01/15/2021 12:26 PM Performed by: Bing Neighbors, FNP Authorized by: Bing Neighbors, FNP   Consent:    Consent obtained:  Verbal   Risks discussed:  Incomplete drainage   Alternatives discussed:  No treatment Universal protocol:    Patient identity confirmed:  Verbally with patient Location:    Type:  Abscess   Location:  Head   Head location:  Scalp Pre-procedure details:    Skin preparation:  Antiseptic wash Sedation:    Sedation type:  None Anesthesia:    Anesthesia method:  Topical application and local infiltration   Topical anesthetic:  LET   Local anesthetic:  Lidocaine 2% WITH epi Procedure type:    Complexity:  Simple Procedure details:    Incision types:  Stab incision   Incision  depth:  Dermal   Drainage:  Purulent   Drainage amount:  Copious Post-procedure details:    Procedure completion:  Tolerated   (including critical care time)  Medications Ordered in UC Medications - No data to display  Initial Impression / Assessment and Plan / UC Course  I have reviewed the triage vital signs and the nursing notes.  Pertinent labs & imaging results that were available during my care of the patient were reviewed by me and considered in my medical decision making (see chart for details).    Dental abscess present treatment with amoxicillin.  Patient tolerated I&D of scalp abscess small dressing applied.  Monitor for signs of infection.  Return precautions given. Final Clinical Impressions(s) / UC Diagnoses   Final diagnoses:  Dental abscess  Scalp abscess     Discharge Instructions         ED Prescriptions    None     PDMP not reviewed this encounter.   Bing Neighbors, FNP 01/15/21 1304

## 2021-01-17 IMAGING — CR DG CHEST 1V
1 series · 1 of 1 positions shown · non-contrast
Comparison: 07/07/2017

CLINICAL DATA: Positive TB test

EXAM:
CHEST  1 VIEW

[w chest pa]
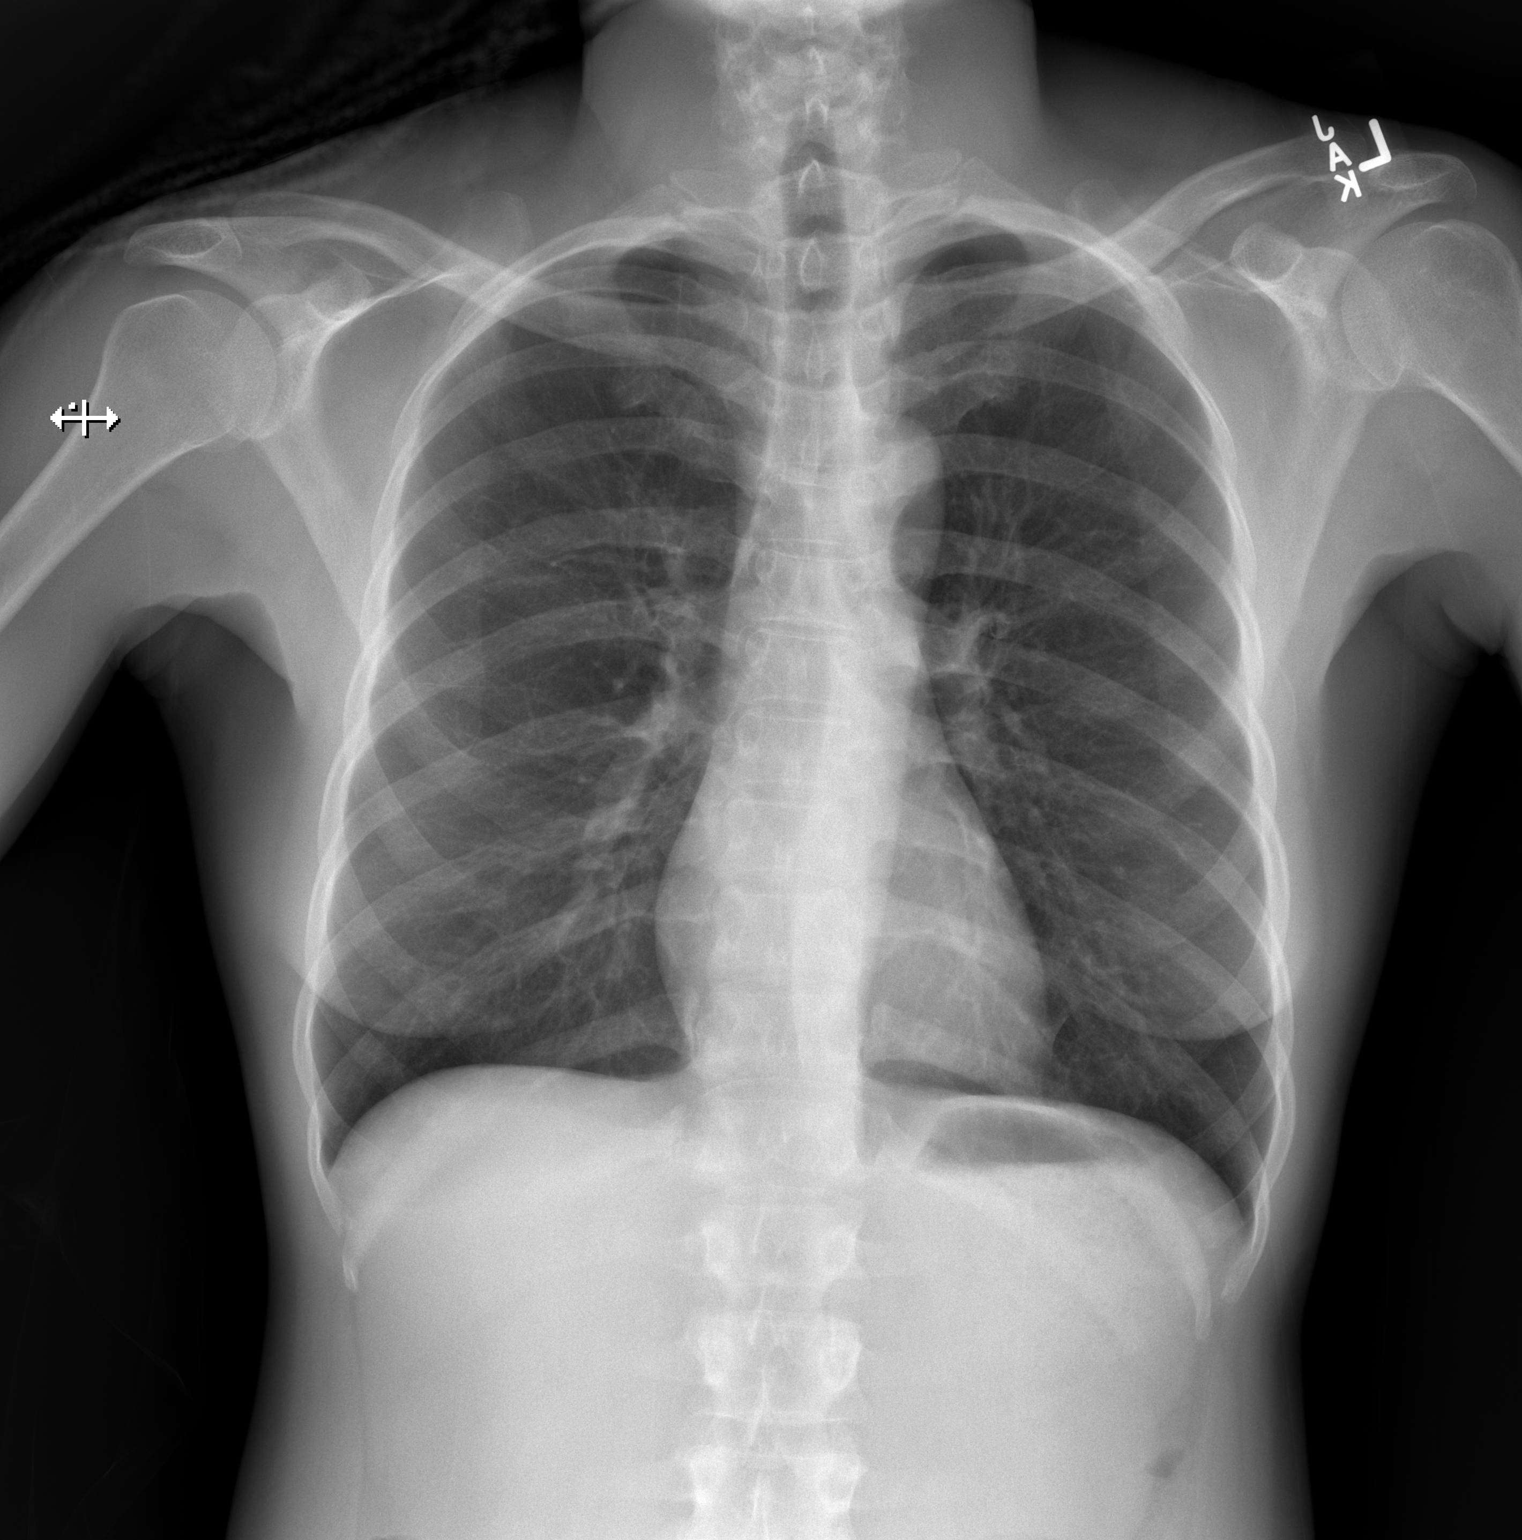

[1 of 1 positions shown; findings below may reference images not displayed]

FINDINGS: No focal consolidation or opacity in the lungs. No acute textural
distortion or postinflammatory sequela to suggest active or
granulomatous disease. No evidence of edema. No pneumothorax or
effusion. The cardiomediastinal contours are unremarkable. No acute
osseous or soft tissue abnormality.
IMPRESSION: No acute cardiopulmonary abnormality or radiographic evidence of
active or correlate granulomatous disease.

## 2022-06-02 ENCOUNTER — Ambulatory Visit
Admission: EM | Admit: 2022-06-02 | Discharge: 2022-06-02 | Disposition: A | Discharge status: Home / Self Care | Payer: Medicaid Other | Attending: Internal Medicine | Admitting: Internal Medicine

## 2022-06-02 DIAGNOSIS — J069 Acute upper respiratory infection, unspecified: Secondary | ICD-10-CM

## 2022-06-02 MED ORDER — FLUTICASONE PROPIONATE 50 MCG/ACT NA SUSP: 1.0000 | Freq: Every day | NASAL | 0 refills | Status: DC

## 2022-06-02 MED ORDER — BENZONATATE 100 MG PO CAPS: 100.0000 mg | ORAL_CAPSULE | Freq: Three times a day (TID) | ORAL | 0 refills | Status: DC | PRN

## 2022-06-02 MED ORDER — ALBUTEROL SULFATE (2.5 MG/3ML) 0.083% IN NEBU: 2.5000 mg | INHALATION_SOLUTION | Freq: Once | RESPIRATORY_TRACT | Status: DC

## 2022-06-02 NOTE — Discharge Instructions (Signed)
It appears that you have a viral upper respiratory infection that should run its course and self resolve with symptomatic treatment.  Please follow-up if symptoms persist or worsen.  COVID test is pending.  We will call if it is positive.  I have sent in 2 medications to help alleviate symptoms.

## 2022-06-02 NOTE — ED Provider Notes (Addendum)
[facility]    CSN: [id number] Arrival date & time: 06/02/22  1152      History   Chief Complaint Chief Complaint  Patient presents with   Headache   Chest Pain   Cough    HPI Hannah May is a 51 y.o. female.   Patient presents with headache, productive cough, nasal congestion that started last night.  Patient reports that she has chest pain that only occurs when she coughs.  Denies shortness of breath, sore throat, ear pain, nausea, vomiting, diarrhea, abdominal pain.  Patient has not taken any medications to help alleviate symptoms.  Denies any known fevers.  Daughter has similar symptoms currently.   Headache Chest Pain Cough   History reviewed. No pertinent past medical history.  There are no problems to display for this patient.   Past Surgical History:  Procedure Laterality Date   TUBAL LIGATION      OB History   No obstetric history on file.      Home Medications    Prior to Admission medications   Medication Sig Start Date End Date Taking? Authorizing Provider  benzonatate (TESSALON) 100 MG capsule Take 1 capsule (100 mg total) by mouth every 8 (eight) hours as needed for cough. 06/02/22  Yes Donato Studley, Hildred Alamin E, FNP  fluticasone (FLONASE) 50 MCG/ACT nasal spray Place 1 spray into both nostrils daily for 3 days. 06/02/22 06/05/22 Yes Cherrish Vitali, Michele Rockers, FNP  amoxicillin (AMOXIL) 875 MG tablet Take 1 tablet (875 mg total) by mouth 2 (two) times daily. 01/15/21   Scot Jun, FNP  ondansetron (ZOFRAN) 4 MG tablet Take 1 tablet (4 mg total) by mouth every 6 (six) hours. 09/14/20   Couture, Cortni S, PA-C  albuterol (PROVENTIL HFA;VENTOLIN HFA) 108 (90 Base) MCG/ACT inhaler Inhale 2 puffs into the lungs every 4 (four) hours as needed for wheezing or shortness of breath. Patient not taking: Reported on 11/12/2018 07/07/17 05/05/20  [place] Lanius A, PA-C  loratadine (CLARITIN) 10 MG tablet Take 1 tablet (10 mg total) by mouth daily. Patient not taking:  Reported on 11/12/2018 06/09/15 05/05/20  Ashley Murrain, NP    Family History History reviewed. No pertinent family history.  Social History Social History   Tobacco Use   Smoking status: Some Days    Packs/day: 0.50    Types: Cigarettes   Smokeless tobacco: Never  Vaping Use   Vaping Use: Never used  Substance Use Topics   Alcohol use: Yes    Comment: weekends only   Drug use: Yes    Types: Marijuana    Comment: 3 x  a day     Allergies   Patient has no known allergies.   Review of Systems Review of Systems Per HPI  Physical Exam Triage Vital Signs ED Triage Vitals  Enc Vitals Group     BP 06/02/22 1214 108/68     Pulse Rate 06/02/22 1212 69     Resp --      Temp 06/02/22 1212 97.8 F (36.6 C)     Temp Source 06/02/22 1212 Oral     SpO2 06/02/22 1212 97 %     Weight --      Height --      Head Circumference --      Peak Flow --      Pain Score 06/02/22 1213 8     Pain Loc --      Pain Edu? --      Excl. in  GC? --    No data found.  Updated Vital Signs BP 108/68 (BP Location: Right Arm)   Pulse 99   Temp 97.8 F (36.6 C)   LMP 05/18/2022   SpO2 97%   Visual Acuity Right Eye Distance:   Left Eye Distance:   Bilateral Distance:    Right Eye Near:   Left Eye Near:    Bilateral Near:     Physical Exam Constitutional:      General: She is not in acute distress.    Appearance: Normal appearance. She is not toxic-appearing or diaphoretic.  HENT:     Head: Normocephalic and atraumatic.     Right Ear: Tympanic membrane and ear canal normal.     Left Ear: Tympanic membrane and ear canal normal.     Nose: Congestion present.     Mouth/Throat:     Mouth: Mucous membranes are moist.     Pharynx: No posterior oropharyngeal erythema.  Eyes:     Extraocular Movements: Extraocular movements intact.     Conjunctiva/sclera: Conjunctivae normal.     Pupils: Pupils are equal, round, and reactive to light.  Cardiovascular:     Rate and Rhythm: Normal  rate and regular rhythm.     Pulses: Normal pulses.     Heart sounds: Normal heart sounds.  Pulmonary:     Effort: Pulmonary effort is normal. No respiratory distress.     Breath sounds: Normal breath sounds. No stridor. No wheezing, rhonchi or rales.  Abdominal:     General: Abdomen is flat. Bowel sounds are normal.     Palpations: Abdomen is soft.  Musculoskeletal:        General: Normal range of motion.     Cervical back: Normal range of motion.  Skin:    General: Skin is warm and dry.  Neurological:     General: No focal deficit present.     Mental Status: She is alert and oriented to person, place, and time. Mental status is at baseline.  Psychiatric:        Mood and Affect: Mood normal.        Behavior: Behavior normal.      UC Treatments / Results  Labs (all labs ordered are listed, but only abnormal results are displayed) Labs Reviewed - No data to display  EKG   Radiology No results found.  Procedures Procedures (including critical care time)  Medications Ordered in UC Medications - No data to display   Initial Impression / Assessment and Plan / UC Course  I have reviewed the triage vital signs and the nursing notes.  Pertinent labs & imaging results that were available during my care of the patient were reviewed by me and considered in my medical decision making (see chart for details).     Patient presents with symptoms likely from a viral upper respiratory infection. Differential includes bacterial pneumonia, sinusitis, allergic rhinitis, Covid 19, flu, RSV. Do not suspect underlying cardiopulmonary process. Symptoms seem unlikely related to ACS, CHF or COPD exacerbations, pneumonia, pneumothorax. Patient is nontoxic appearing and not in need of emergent medical intervention.  Do not think chest imaging is necessary given no adventitious lung sounds on exam and no signs of respiratory compromise.  COVID test was not ordered prior to discharge. Attempted to  call patient back to discuss returning for covid swab but no answer and unable to LVM.  Recommended symptom control with ove the counter medications.  Patient sent prescriptions.  Return if symptoms fail  to improve in 1-2 weeks or you develop shortness of breath, chest pain, severe headache. Patient states understanding and is agreeable.  Discharged with PCP followup.  Final Clinical Impressions(s) / UC Diagnoses   Final diagnoses:  Viral upper respiratory tract infection with cough     Discharge Instructions      It appears that you have a viral upper respiratory infection that should run its course and self resolve with symptomatic treatment.  Please follow-up if symptoms persist or worsen.  COVID test is pending.  We will call if it is positive.  I have sent in 2 medications to help alleviate symptoms.     ED Prescriptions     Medication Sig Dispense Auth. Provider   fluticasone (FLONASE) 50 MCG/ACT nasal spray Place 1 spray into both nostrils daily for 3 days. 16 g Ajanee Buren, Hildred Alamin E, [place]   benzonatate (TESSALON) 100 MG capsule Take 1 capsule (100 mg total) by mouth every 8 (eight) hours as needed for cough. 21 capsule [place], Michele Rockers, [place]      PDMP not reviewed this encounter.   Teodora Medici, [place] 06/02/22 [place], [place], [place] 06/02/22 1354

## 2022-06-02 NOTE — ED Triage Notes (Signed)
Pt c/o of heachache, cough with yellow mucus, and chest pain with cough  No recent falls within last 3 months

## 2024-01-04 ENCOUNTER — Ambulatory Visit
Admission: EM | Admit: 2024-01-04 | Discharge: 2024-01-04 | Disposition: A | Discharge status: Home / Self Care | Attending: Emergency Medicine | Admitting: Emergency Medicine

## 2024-01-04 ENCOUNTER — Encounter: Payer: Self-pay | Admitting: Emergency Medicine

## 2024-01-04 DIAGNOSIS — S6991XA Unspecified injury of right wrist, hand and finger(s), initial encounter: Secondary | ICD-10-CM

## 2024-01-04 MED ORDER — IBUPROFEN 800 MG PO TABS: 800.0000 mg | ORAL_TABLET | Freq: Three times a day (TID) | ORAL | 0 refills | Status: DC

## 2024-01-04 NOTE — Discharge Instructions (Signed)
 Wear the finger splint throughout the day and night to help provide support to your finger.  You can take 800 mg of ibuprofen  every 8 hours to help with pain and inflammation.  If over the next week the swelling and pain do not improve, please follow-up with an orthopedic for further advanced evaluation of your pinky.   Return to clinic for any new or urgent symptoms.

## 2024-01-04 NOTE — ED Provider Notes (Signed)
 [facility]    CSN: [id number] Arrival date & time: 01/04/24  1333      History   Chief Complaint Chief Complaint  Patient presents with   Finger Injury    HPI Hannah May is a 53 y.o. female.   Patient presents to clinic over concerns of right pinky pain and swelling.  She thinks she hit it on something around a week ago.  The distal area is tender to palpation.  She has some mild swelling.  No warmth or drainage.  Reports she does not think it is broken.  Does have trouble fully extending it straight.  Is unsure what the exact mechanism of injury was, if there was any twisting, pulling, or what exactly happened to injure her finger.  Has not taken any medications or tried any interventions for her pain or swelling.  The history is provided by the patient and medical records.    History reviewed. No pertinent past medical history.  There are no active problems to display for this patient.   Past Surgical History:  Procedure Laterality Date   TUBAL LIGATION      OB History   No obstetric history on file.      Home Medications    Prior to Admission medications   Medication Sig Start Date End Date Taking? Authorizing Provider  ibuprofen  (ADVIL ) 800 MG tablet Take 1 tablet (800 mg total) by mouth 3 (three) times daily. 01/04/24  Yes Addysyn Fern  N, FNP  amoxicillin  (AMOXIL ) 875 MG tablet Take 1 tablet (875 mg total) by mouth 2 (two) times daily. 01/15/21   Buena Carmine, NP  benzonatate  (TESSALON ) 100 MG capsule Take 1 capsule (100 mg total) by mouth every 8 (eight) hours as needed for cough. 06/02/22   Dodson Freestone, FNP  fluticasone  (FLONASE ) 50 MCG/ACT nasal spray Place 1 spray into both nostrils daily for 3 days. 06/02/22 06/05/22  Dodson Freestone, FNP  ondansetron  (ZOFRAN ) 4 MG tablet Take 1 tablet (4 mg total) by mouth every 6 (six) hours. 09/14/20   Couture, Cortni S, PA-C  albuterol  (PROVENTIL  HFA;VENTOLIN  HFA) 108 (90 Base) MCG/ACT inhaler  Inhale 2 puffs into the lungs every 4 (four) hours as needed for wheezing or shortness of breath. Patient not taking: Reported on 11/12/2018 07/07/17 05/05/20  Layden, Lindsey A, PA-C  loratadine  (CLARITIN ) 10 MG tablet Take 1 tablet (10 mg total) by mouth daily. Patient not taking: Reported on 11/12/2018 06/09/15 05/05/20  Hardie Leyland, NP    Family History History reviewed. No pertinent family history.  Social History Social History   Tobacco Use   Smoking status: Every Day    Current packs/day: 0.50    Types: Cigarettes    Passive exposure: Current   Smokeless tobacco: Never  Vaping Use   Vaping status: Never Used  Substance Use Topics   Alcohol use: Yes    Comment: weekends only   Drug use: Yes    Types: Marijuana    Comment: 3 x  a day     Allergies   Patient has no known allergies.   Review of Systems Review of Systems  Per HPI  Physical Exam Triage Vital Signs ED Triage Vitals  Encounter Vitals Group     BP 01/04/24 1511 132/80     Systolic BP Percentile --      Diastolic BP Percentile --      Pulse Rate 01/04/24 1511 66     Resp 01/04/24 1511 18  Temp 01/04/24 1511 (!) 97.5 F (36.4 C)     Temp Source 01/04/24 1511 Oral     SpO2 01/04/24 1511 98 %     Weight 01/04/24 1510 120 lb (54.4 kg)     Height --      Head Circumference --      Peak Flow --      Pain Score 01/04/24 1509 5     Pain Loc --      Pain Education --      Exclude from Growth Chart --    No data found.  Updated Vital Signs BP 132/80 (BP Location: Left Arm)   Pulse 66   Temp (!) 97.5 F (36.4 C) (Oral)   Resp 18   Wt 120 lb (54.4 kg)   LMP  (LMP Unknown)   SpO2 98%   BMI 21.95 kg/m   Visual Acuity Right Eye Distance:   Left Eye Distance:   Bilateral Distance:    Right Eye Near:   Left Eye Near:    Bilateral Near:     Physical Exam Vitals and nursing note reviewed.  Constitutional:      Appearance: Normal appearance.  HENT:     Head: Normocephalic.     Right  Ear: External ear normal.     Left Ear: External ear normal.     Nose: Nose normal.     Mouth/Throat:     Mouth: Mucous membranes are moist.  Eyes:     Conjunctiva/sclera: Conjunctivae normal.  Cardiovascular:     Rate and Rhythm: Normal rate.  Pulmonary:     Effort: Pulmonary effort is normal. No respiratory distress.  Musculoskeletal:        General: Swelling and tenderness present.     Right hand: Swelling present. Normal range of motion. Normal sensation. Normal capillary refill.     Comments: Mild swelling at distal end of right pinky, unable to fully straighten out pinky finger. W/o warmth or erythema.   Skin:    General: Skin is warm and dry.     Capillary Refill: Capillary refill takes less than 2 seconds.  Neurological:     General: No focal deficit present.     Mental Status: She is alert.  Psychiatric:        Mood and Affect: Mood normal.      UC Treatments / Results  Labs (all labs ordered are listed, but only abnormal results are displayed) Labs Reviewed - No data to display  EKG   Radiology No results found.  Procedures Procedures (including critical care time)  Medications Ordered in UC Medications - No data to display  Initial Impression / Assessment and Plan / UC Course  I have reviewed the triage vital signs and the nursing notes.  Pertinent labs & imaging results that were available during my care of the patient were reviewed by me and considered in my medical decision making (see chart for details).  Vitals in triage reviewed, patient is hemodynamically stable.  Left distal pinky with swelling and diminished range of motion.  Brisk capillary refill.  Area does not warm or erythematous, without drainage or concerns for cellulitis or paronychia.  Suspect some kind of ligament injury to the pinky.  She is not overly tender to palpation, low concern for acute fracture at this time.  Discussed that we do not have imaging today in clinic, but she is  certain that she does not have a break / fx.   Finger splint and  buddy taping applied in clinic.  Pain management discussed.  Orthopedic follow-up encouraged.  Plan of care, follow-up care return precautions given, no questions at this time.     Final Clinical Impressions(s) / UC Diagnoses   Final diagnoses:  Injury of right little finger, initial encounter     Discharge Instructions      Wear the finger splint throughout the day and night to help provide support to your finger.  You can take 800 mg of ibuprofen  every 8 hours to help with pain and inflammation.  If over the next week the swelling and pain do not improve, please follow-up with an orthopedic for further advanced evaluation of your pinky.   Return to clinic for any new or urgent symptoms.     ED Prescriptions     Medication Sig Dispense Auth. Provider   ibuprofen  (ADVIL ) 800 MG tablet Take 1 tablet (800 mg total) by mouth 3 (three) times daily. 21 tablet Harlow Lighter, Lucan Riner  N, FNP      PDMP not reviewed this encounter.   Rannie Byars, FNP 01/04/24 1525

## 2024-01-04 NOTE — ED Triage Notes (Signed)
 Pt c/o right pinky finger pain. Pt says she can bend the finger but it is in lots of pain.

## 2024-03-27 ENCOUNTER — Encounter (HOSPITAL_COMMUNITY): Payer: Self-pay

## 2024-03-27 ENCOUNTER — Emergency Department (HOSPITAL_COMMUNITY)
Admission: EM | Admit: 2024-03-27 | Discharge: 2024-03-28 | Disposition: A | Discharge status: Home / Self Care | Payer: Self-pay | Attending: Emergency Medicine | Admitting: Emergency Medicine

## 2024-03-27 ENCOUNTER — Other Ambulatory Visit: Payer: Self-pay

## 2024-03-27 DIAGNOSIS — N179 Acute kidney failure, unspecified: Secondary | ICD-10-CM | POA: Insufficient documentation

## 2024-03-27 DIAGNOSIS — R55 Syncope and collapse: Secondary | ICD-10-CM | POA: Insufficient documentation

## 2024-03-27 DIAGNOSIS — N3000 Acute cystitis without hematuria: Secondary | ICD-10-CM | POA: Insufficient documentation

## 2024-03-27 LAB — TROPONIN I (HIGH SENSITIVITY)
Troponin I (High Sensitivity): 3 ng/L (ref <18)
Troponin I (High Sensitivity): <2 ng/L (ref <18)

## 2024-03-27 LAB — CBC WITH DIFFERENTIAL/PLATELET
Abs Immature Granulocytes: 0.01 K/uL (ref 0.00–0.07)
Basophils Absolute: 0.1 K/uL (ref 0.0–0.1)
Basophils Relative: 1 %
Eosinophils Absolute: 0.6 K/uL — ABNORMAL HIGH (ref 0.0–0.5)
Eosinophils Relative: 9 %
HCT: 40.3 % (ref 36.0–46.0)
Hemoglobin: 13.5 g/dL (ref 12.0–15.0)
Immature Granulocytes: 0 %
Lymphocytes Relative: 41 %
Lymphs Abs: 3 K/uL (ref 0.7–4.0)
MCH: 30.1 pg (ref 26.0–34.0)
MCHC: 33.5 g/dL (ref 30.0–36.0)
MCV: 90 fL (ref 80.0–100.0)
Monocytes Absolute: 0.7 K/uL (ref 0.1–1.0)
Monocytes Relative: 10 %
Neutro Abs: 2.9 K/uL (ref 1.7–7.7)
Neutrophils Relative %: 39 %
Platelets: 290 K/uL (ref 150–400)
RBC: 4.48 MIL/uL (ref 3.87–5.11)
RDW: 14.3 % (ref 11.5–15.5)
WBC: 7.4 K/uL (ref 4.0–10.5)
nRBC: 0 % (ref 0.0–0.2)

## 2024-03-27 LAB — URINALYSIS, ROUTINE W REFLEX MICROSCOPIC
Bilirubin Urine: NEGATIVE
Glucose, UA: NEGATIVE mg/dL
Ketones, ur: NEGATIVE mg/dL
Nitrite: POSITIVE — AB
Protein, ur: 30 mg/dL — AB
Specific Gravity, Urine: 1.023 (ref 1.005–1.030)
pH: 5 (ref 5.0–8.0)

## 2024-03-27 LAB — COMPREHENSIVE METABOLIC PANEL WITH GFR
ALT: 13 U/L (ref 0–44)
AST: 18 U/L (ref 15–41)
Albumin: 3.6 g/dL (ref 3.5–5.0)
Alkaline Phosphatase: 68 U/L (ref 38–126)
Anion gap: 10 (ref 5–15)
BUN: 18 mg/dL (ref 6–20)
CO2: 25 mmol/L (ref 22–32)
Calcium: 9.1 mg/dL (ref 8.9–10.3)
Chloride: 104 mmol/L (ref 98–111)
Creatinine, Ser: 1.54 mg/dL — ABNORMAL HIGH (ref 0.44–1.00)
GFR, Estimated: 41 mL/min — ABNORMAL LOW (ref >60)
Glucose, Bld: 90 mg/dL (ref 70–99)
Potassium: 4 mmol/L (ref 3.5–5.1)
Sodium: 139 mmol/L (ref 135–145)
Total Bilirubin: 0.6 mg/dL (ref 0.0–1.2)
Total Protein: 7.4 g/dL (ref 6.5–8.1)

## 2024-03-27 LAB — HCG, SERUM, QUALITATIVE: Preg, Serum: NEGATIVE

## 2024-03-27 LAB — CBG MONITORING, ED: Glucose-Capillary: 108 mg/dL — ABNORMAL HIGH (ref 70–99)

## 2024-03-27 MED ORDER — SODIUM CHLORIDE 0.9 % IV SOLN
1.0000 g | Freq: Once | INTRAVENOUS | Status: AC
  Administered 2024-03-27: 1 g via INTRAVENOUS

## 2024-03-27 MED ORDER — CEPHALEXIN 250 MG PO CAPS: 250.0000 mg | ORAL_CAPSULE | Freq: Four times a day (QID) | ORAL | 0 refills | Status: DC

## 2024-03-27 MED ORDER — LACTATED RINGERS IV BOLUS
1000.0000 mL | Freq: Once | INTRAVENOUS | Status: AC
  Administered 2024-03-27: 1000 mL via INTRAVENOUS

## 2024-03-27 MED ORDER — SODIUM CHLORIDE 0.9 % IV SOLN
INTRAVENOUS | Status: AC
  Filled 2024-03-27: qty 10

## 2024-03-27 NOTE — ED Triage Notes (Signed)
 Patient BIB [facility] from work after witnessed syncopal episode. Patient felt hot and went in to walk-in and when she came out had a syncopal episode lasting 5-10 seconds. Patient woke immediately and had GCS 15 per bystanders, EMS EKG indicated stemi and patient given 324mg  aspirin and 0.4mg  nitroglycerin lowering patient's pressure from 140 to 120.

## 2024-03-27 NOTE — ED Provider Notes (Signed)
 [facility] Provider Note   CSN: [id number] Arrival date & time: 03/27/24  1925     History {Add pertinent medical, surgical, social history, OB history to HPI:1} Chief Complaint  Patient presents with   Loss of Consciousness    Hannah May is a 53 y.o. female with PMH as listed below who presents BIB [facility] from work at [employer] after witnessed syncopal episode. Patient felt hot and went in to walk-in freezer to cool down. When she came out, she was still hot, felt nauseated and walked to the bathroom and passed out. Lasted 5-10 seconds. Patient woke immediately and had GCS 15 per bystanders. Bystanders noted that she did not hit her head. Patient denies pain anywhere except her left lower leg. Patient denies any chest pain, shortness of breath, h/o DVT/PE, leg swelling, fam hx of SCD, personal cardiac history, prior h/o syncope. Doesn't take hormones, no recent travel/hospitalizations/surgeries.  EMS EKG indicated stemi and patient given 324mg  aspirin and 0.4mg  nitroglycerin.    History reviewed. No pertinent past medical history.     Home Medications Prior to Admission medications   Medication Sig Start Date End Date Taking? Authorizing Provider  amoxicillin  (AMOXIL ) 875 MG tablet Take 1 tablet (875 mg total) by mouth 2 (two) times daily. 01/15/21   Arloa Suzen RAMAN, NP  benzonatate  (TESSALON ) 100 MG capsule Take 1 capsule (100 mg total) by mouth every 8 (eight) hours as needed for cough. 06/02/22   Hazen Darryle BRAVO, FNP  fluticasone  (FLONASE ) 50 MCG/ACT nasal spray Place 1 spray into both nostrils daily for 3 days. 06/02/22 06/05/22  Hazen Darryle BRAVO, FNP  ibuprofen  (ADVIL ) 800 MG tablet Take 1 tablet (800 mg total) by mouth 3 (three) times daily. 01/04/24   [place], [place]  N, FNP  ondansetron  (ZOFRAN ) 4 MG tablet Take 1 tablet (4 mg total) by mouth every 6 (six) hours. 09/14/20   Couture, Cortni S, PA-C  albuterol  (PROVENTIL  HFA;VENTOLIN   HFA) 108 (90 Base) MCG/ACT inhaler Inhale 2 puffs into the lungs every 4 (four) hours as needed for wheezing or shortness of breath. Patient not taking: Reported on 11/12/2018 07/07/17 05/05/20  Layden, Lindsey A, PA-C  loratadine  (CLARITIN ) 10 MG tablet Take 1 tablet (10 mg total) by mouth daily. Patient not taking: Reported on 11/12/2018 06/09/15 05/05/20  Jamelle Lorrayne HERO, NP      Allergies    Patient has no known allergies.    Review of Systems   Review of Systems A 10 point review of systems was performed and is negative unless otherwise reported in HPI.  Physical Exam Updated Vital Signs BP (!) 128/91   Temp 97.6 F (36.4 C) (Oral)   Resp 13   Ht 5' 2 (1.575 m)   Wt 59 kg   LMP  (LMP Unknown)   SpO2 98%   BMI 23.78 kg/m  Physical Exam General: Normal appearing female, lying in bed.  HEENT: NCAT, PERRLA, Sclera anicteric, MMM, trachea midline.  Cardiology: RRR, no murmurs/rubs/gallops. Resp: Normal respiratory rate and effort. CTAB, no wheezes, rhonchi, crackles.  Abd: Soft, non-tender, non-distended. No rebound tenderness or guarding.  GU: Deferred. MSK: Mild TTP to left lateral lower leg with no deformity or trauma noted. No peripheral edema or signs of trauma.  Skin: warm, dry.  Neuro: A&Ox4, CNs II-XII grossly intact. MAEs. Sensation grossly intact.  Psych: Normal mood and affect.   ED Results / Procedures / Treatments   Labs (all labs ordered are  listed, but only abnormal results are displayed) Labs Reviewed  CBG MONITORING, ED - Abnormal; Notable for the following components:      Result Value   Glucose-Capillary 108 (*)    All other components within normal limits  CBC WITH DIFFERENTIAL/PLATELET  COMPREHENSIVE METABOLIC PANEL WITH GFR  HCG, SERUM, QUALITATIVE  URINALYSIS, ROUTINE W REFLEX MICROSCOPIC  TROPONIN I (HIGH SENSITIVITY)    EKG None  Radiology No results found.  Procedures Procedures  {Document cardiac monitor, telemetry assessment procedure  when appropriate:1}  Medications Ordered in ED Medications - No data to display  ED Course/ Medical Decision Making/ A&P                          Medical Decision Making Amount and/or Complexity of Data Reviewed Labs: ordered. Decision-making details documented in ED Course.    This patient presents to the ED for concern of syncope, this involves an extensive number of treatment options, and is a complaint that carries with it a high risk of complications and morbidity.  I considered the following differential and admission for this acute, potentially life threatening condition.   MDM:    DDX for syncope includes but is not limited to:  Consider anemia and electrolyte abnormalities as possible etiologies. Consider arrhythmias and ACS, although without associated symptoms, less likely. Consider hemorrhage vs CVA, although neuro intact now for 24 hours and no associated other symptoms. Consider mets given known underlying cancer. Consider PE as well, although without hypoxia or sob. Consider broad differential.  *** Given history, exam and workup, low suspicion for HF, ICH (no trauma, headache), seizure (no witnessed seizure like activity, no postictal period, tongue laceration, bladder incontinence), stroke (no focal neuro deficits), HOCM (no murmur, family history of sudden death), ACS (neg troponin, no anginal pain), aortic dissection (no chest pain), malignant arrhythmia on ekg or any family history of sudden death, or GI bleed (stable hgb). Low suspicion for PE given normal vital signs, absence of chest pain or dyspnea, no evidence of DVT, no recent surgery/immobilization. Based on canadian syncope rule, patient is low risk and well appearing here, plan to discharge the patient home with PMD follow up.    Clinical Course as of 03/27/24 1940  Tue Mar 27, 2024  1928 Glucose-Capillary(!): 108 [HN]    Clinical Course User Index [HN] Franklyn Sid SAILOR, MD    Labs: I Ordered, and  personally interpreted labs.  The pertinent results include:  those listed above  Additional history obtained from chart review.    Cardiac Monitoring: The patient was maintained on a cardiac monitor.  I personally viewed and interpreted the cardiac monitored which showed an underlying rhythm of: NSR  Reevaluation: After the interventions noted above, I reevaluated the patient and found that they have :improved  Social Determinants of Health: Lives independently  Disposition:  ***  Co morbidities that complicate the patient evaluation History reviewed. No pertinent past medical history.   Medicines No orders of the defined types were placed in this encounter.   I have reviewed the patients home medicines and have made adjustments as needed  Problem List / ED Course: Problem List Items Addressed This Visit   None        {Document critical care time when appropriate:1} {Document review of labs and clinical decision tools ie heart score, Chads2Vasc2 etc:1}  {Document your independent review of radiology images, and any outside records:1} {Document your discussion with family members, caretakers, and  with consultants:1} {Document social determinants of health affecting pt's care:1} {Document your decision making why or why not admission, treatments were needed:1}  This note was created using dictation software, which may contain spelling or grammatical errors.

## 2024-04-01 LAB — URINE CULTURE: Culture: 80000 — AB

## 2024-04-02 ENCOUNTER — Telehealth (HOSPITAL_BASED_OUTPATIENT_CLINIC_OR_DEPARTMENT_OTHER): Payer: Self-pay | Admitting: *Deleted

## 2024-04-02 NOTE — Telephone Encounter (Signed)
 Post ED Visit - Positive Culture Follow-up  Culture report reviewed by antimicrobial stewardship pharmacist: [facility] []  Rankin Dee, Pharm.D. []  Venetia Gully, Pharm.D., BCPS AQ-ID []  Garrel Crews, Pharm.D., BCPS []  Almarie Lunger, Pharm.D., BCPS []  [place], [address].D., BCPS, AAHIVP []  Rosaline Bihari, Pharm.D., BCPS, AAHIVP []  Vernell Meier, PharmD, BCPS []  Latanya Hint, PharmD, BCPS []  Donald Medley, PharmD, BCPS []  Rocky Bold, PharmD []  Dorothyann Alert, PharmD, BCPS [x]  Leonor Eland, PharmD  [facility] []  Rosaline Edison, PharmD []  Romona Bliss, PharmD []  Dolphus Roller, PharmD []  Veva Seip, Rph []  Vernell Daunt) Leonce, PharmD []  Eva Allis, PharmD []  Rosaline Millet, PharmD []  Iantha Batch, PharmD []  Arvin Gauss, PharmD []  Wanda Hasting, PharmD []  Ronal Rav, PharmD []  Rocky Slade, PharmD []  Bard Jeans, PharmD   Positive urin culture Treated with Cephalexin , organism sensitive to the same and no further patient follow-up is required at this time.  Albino Alan Novak 04/02/2024, 7:58 AM

## 2024-05-25 ENCOUNTER — Other Ambulatory Visit: Payer: Self-pay

## 2024-05-25 ENCOUNTER — Ambulatory Visit
Admission: EM | Admit: 2024-05-25 | Discharge: 2024-05-25 | Disposition: A | Discharge status: Home / Self Care | Attending: Family Medicine | Admitting: Family Medicine

## 2024-05-25 ENCOUNTER — Encounter: Payer: Self-pay | Admitting: *Deleted

## 2024-05-25 DIAGNOSIS — J069 Acute upper respiratory infection, unspecified: Secondary | ICD-10-CM

## 2024-05-25 DIAGNOSIS — R051 Acute cough: Secondary | ICD-10-CM

## 2024-05-25 LAB — POC SOFIA SARS ANTIGEN FIA: SARS Coronavirus 2 Ag: NEGATIVE

## 2024-05-25 MED ORDER — BENZONATATE 100 MG PO CAPS: 100.0000 mg | ORAL_CAPSULE | Freq: Three times a day (TID) | ORAL | 0 refills | Status: AC | PRN

## 2024-05-25 NOTE — ED Triage Notes (Signed)
 Pt reports URI symptoms x 2 days. C/o nasal congestion, cough, subjective fever. She has been been taking nyquil without relief.

## 2024-05-25 NOTE — Discharge Instructions (Signed)
 You were seen today for upper respiratory symptoms.  Your covid swab was negative today.  This appears to be due to another virus.  I have sent out a medication to help with cough.  Otherwise I recommend you use over the counter medications for cold and cough.  You may use tylenol /motrin  for pain and fever.  Please get plenty of rest and fluids.   You may return if not improving or worsening.

## 2024-05-25 NOTE — ED Provider Notes (Signed)
 [facility]    CSN: [id number] Arrival date & time: 05/25/24  1443      History   Chief Complaint Chief Complaint  Patient presents with   Cough    HPI Hannah May is a 53 y.o. female.    Cough Associated symptoms: rhinorrhea    Patient is here for URI symptoms x 2 days.  Cough, nasal congestion, drainage.  No wheezing or sob.  It hurts when she coughs.  No fevers.  Taking nyquil.  No known sick contacts.        History reviewed. No pertinent past medical history.  There are no active problems to display for this patient.   Past Surgical History:  Procedure Laterality Date   TUBAL LIGATION      OB History   No obstetric history on file.      Home Medications    Prior to Admission medications   Medication Sig Start Date End Date Taking? Authorizing Provider  benzonatate  (TESSALON ) 100 MG capsule Take 1 capsule (100 mg total) by mouth every 8 (eight) hours as needed for cough. Patient not taking: Reported on 05/25/2024 06/02/22   Hazen Darryle BRAVO, FNP  cephALEXin  (KEFLEX ) 250 MG capsule Take 1 capsule (250 mg total) by mouth 4 (four) times daily. Patient not taking: Reported on 05/25/2024 03/27/24   Franklyn Sid SAILOR, MD  fluticasone  (FLONASE ) 50 MCG/ACT nasal spray Place 1 spray into both nostrils daily for 3 days. Patient not taking: Reported on 05/25/2024 06/02/22 06/05/22  Hazen Darryle BRAVO, FNP  ibuprofen  (ADVIL ) 800 MG tablet Take 1 tablet (800 mg total) by mouth 3 (three) times daily. Patient not taking: Reported on 05/25/2024 01/04/24   Dreama Lelon SAILOR, FNP  ondansetron  (ZOFRAN ) 4 MG tablet Take 1 tablet (4 mg total) by mouth every 6 (six) hours. Patient not taking: Reported on 05/25/2024 09/14/20   Couture, Cortni S, PA-C  albuterol  (PROVENTIL  HFA;VENTOLIN  HFA) 108 (90 Base) MCG/ACT inhaler Inhale 2 puffs into the lungs every 4 (four) hours as needed for wheezing or shortness of breath. Patient not taking: Reported on 11/12/2018 07/07/17 05/05/20   Layden, Lindsey A, PA-C  loratadine  (CLARITIN ) 10 MG tablet Take 1 tablet (10 mg total) by mouth daily. Patient not taking: Reported on 11/12/2018 06/09/15 05/05/20  Jamelle Lorrayne HERO, NP    Family History History reviewed. No pertinent family history.  Social History Social History   Tobacco Use   Smoking status: Every Day    Current packs/day: 0.50    Types: Cigarettes    Passive exposure: Current   Smokeless tobacco: Never  Vaping Use   Vaping status: Never Used  Substance Use Topics   Alcohol use: Yes    Comment: weekends only   Drug use: Yes    Types: Marijuana    Comment: 3 x  a day     Allergies   Patient has no known allergies.   Review of Systems Review of Systems  Constitutional: Negative.   HENT:  Positive for congestion and rhinorrhea.   Respiratory:  Positive for cough.   Cardiovascular: Negative.   Gastrointestinal: Negative.   Genitourinary: Negative.   Musculoskeletal: Negative.   Psychiatric/Behavioral: Negative.       Physical Exam Triage Vital Signs ED Triage Vitals  Encounter Vitals Group     BP 05/25/24 1510 112/70     Girls Systolic BP Percentile --      Girls Diastolic BP Percentile --      Boys Systolic BP  Percentile --      Boys Diastolic BP Percentile --      Pulse Rate 05/25/24 1510 87     Resp 05/25/24 1510 18     Temp 05/25/24 1510 97.8 F (36.6 C)     Temp Source 05/25/24 1510 Oral     SpO2 05/25/24 1510 97 %     Weight --      Height --      Head Circumference --      Peak Flow --      Pain Score 05/25/24 1507 0     Pain Loc --      Pain Education --      Exclude from Growth Chart --    No data found.  Updated Vital Signs BP 112/70 (BP Location: Left Arm)   Pulse 87   Temp 97.8 F (36.6 C) (Oral)   Resp 18   LMP  (LMP Unknown)   SpO2 97%   Visual Acuity Right Eye Distance:   Left Eye Distance:   Bilateral Distance:    Right Eye Near:   Left Eye Near:    Bilateral Near:     Physical Exam Constitutional:       General: She is not in acute distress.    Appearance: Normal appearance. She is normal weight. She is ill-appearing. She is not toxic-appearing.  HENT:     Nose: Congestion and rhinorrhea present.     Right Sinus: No maxillary sinus tenderness or frontal sinus tenderness.     Left Sinus: No maxillary sinus tenderness or frontal sinus tenderness.     Mouth/Throat:     Mouth: Mucous membranes are moist.  Cardiovascular:     Rate and Rhythm: Normal rate and regular rhythm.  Pulmonary:     Effort: Pulmonary effort is normal.     Breath sounds: No wheezing or rhonchi.  Musculoskeletal:     Cervical back: Normal range of motion and neck supple. No tenderness.  Lymphadenopathy:     Cervical: No cervical adenopathy.  Skin:    General: Skin is warm.  Neurological:     General: No focal deficit present.     Mental Status: She is alert.  Psychiatric:        Mood and Affect: Mood normal.      UC Treatments / Results  Labs (all labs ordered are listed, but only abnormal results are displayed) Labs Reviewed  POC SOFIA SARS ANTIGEN FIA - Normal    EKG   Radiology No results found.  Procedures Procedures (including critical care time)  Medications Ordered in UC Medications - No data to display  Initial Impression / Assessment and Plan / UC Course  I have reviewed the triage vital signs and the nursing notes.  Pertinent labs & imaging results that were available during my care of the patient were reviewed by me and considered in my medical decision making (see chart for details).   Final Clinical Impressions(s) / UC Diagnoses   Final diagnoses:  Acute cough  Viral URI with cough     Discharge Instructions      You were seen today for upper respiratory symptoms.  Your covid swab was negative today.  This appears to be due to another virus.  I have sent out a medication to help with cough.  Otherwise I recommend you use over the counter medications for cold and  cough.  You may use tylenol /motrin  for pain and fever.  Please get plenty of rest  and fluids.   You may return if not improving or worsening.     ED Prescriptions     Medication Sig Dispense Auth. Provider   benzonatate  (TESSALON ) 100 MG capsule Take 1 capsule (100 mg total) by mouth every 8 (eight) hours as needed for cough. 21 capsule Darral Longs, MD      PDMP not reviewed this encounter.   Darral Longs, MD 05/25/24 1537
